# Patient Record
Sex: Female | Born: 1985 | Race: White | Hispanic: No | Marital: Single | Smoking: Former smoker
Health system: Southern US, Community
[De-identification: ages and names within clinical notes are randomized; demographics above are authoritative.]

## PROBLEM LIST (undated history)

## (undated) DIAGNOSIS — F329 Major depressive disorder, single episode, unspecified: Secondary | ICD-10-CM

## (undated) DIAGNOSIS — E079 Disorder of thyroid, unspecified: Secondary | ICD-10-CM

## (undated) DIAGNOSIS — F32A Depression, unspecified: Secondary | ICD-10-CM

## (undated) DIAGNOSIS — F419 Anxiety disorder, unspecified: Secondary | ICD-10-CM

## (undated) HISTORY — DX: Depression, unspecified: F32.A

## (undated) HISTORY — DX: Disorder of thyroid, unspecified: E07.9

## (undated) HISTORY — DX: Major depressive disorder, single episode, unspecified: F32.9

## (undated) HISTORY — DX: Anxiety disorder, unspecified: F41.9

---
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
## 2006-05-18 ENCOUNTER — Ambulatory Visit (HOSPITAL_COMMUNITY): Payer: Self-pay | Admitting: Psychiatry

## 2006-11-08 ENCOUNTER — Ambulatory Visit (HOSPITAL_COMMUNITY): Payer: Self-pay | Admitting: Psychiatry

## 2007-02-08 ENCOUNTER — Ambulatory Visit (HOSPITAL_COMMUNITY): Payer: Self-pay | Admitting: Psychiatry

## 2007-12-19 ENCOUNTER — Ambulatory Visit (HOSPITAL_COMMUNITY): Payer: Self-pay | Admitting: Psychiatry

## 2008-02-05 ENCOUNTER — Emergency Department (HOSPITAL_COMMUNITY): Admission: EM | Admit: 2008-02-05 | Discharge: 2008-02-05 | Payer: Self-pay | Admitting: Emergency Medicine

## 2008-03-15 ENCOUNTER — Ambulatory Visit (HOSPITAL_COMMUNITY): Payer: Self-pay | Admitting: Psychiatry

## 2008-06-11 ENCOUNTER — Ambulatory Visit (HOSPITAL_COMMUNITY): Payer: Self-pay | Admitting: Psychiatry

## 2012-09-18 ENCOUNTER — Ambulatory Visit: Payer: Self-pay

## 2012-09-21 ENCOUNTER — Ambulatory Visit: Payer: Self-pay

## 2012-09-27 ENCOUNTER — Ambulatory Visit: Payer: No Typology Code available for payment source | Attending: Family Medicine | Admitting: Family Medicine

## 2012-09-27 VITALS — BP 134/88 | HR 74 | Temp 98.3°F | Resp 20 | Ht 67.0 in | Wt 337.0 lb

## 2012-09-27 DIAGNOSIS — F3289 Other specified depressive episodes: Secondary | ICD-10-CM

## 2012-09-27 DIAGNOSIS — Z833 Family history of diabetes mellitus: Secondary | ICD-10-CM

## 2012-09-27 DIAGNOSIS — IMO0002 Reserved for concepts with insufficient information to code with codable children: Secondary | ICD-10-CM

## 2012-09-27 DIAGNOSIS — K029 Dental caries, unspecified: Secondary | ICD-10-CM

## 2012-09-27 DIAGNOSIS — M1990 Unspecified osteoarthritis, unspecified site: Secondary | ICD-10-CM

## 2012-09-27 DIAGNOSIS — M549 Dorsalgia, unspecified: Secondary | ICD-10-CM

## 2012-09-27 DIAGNOSIS — T7421XA Adult sexual abuse, confirmed, initial encounter: Secondary | ICD-10-CM

## 2012-09-27 DIAGNOSIS — F4310 Post-traumatic stress disorder, unspecified: Secondary | ICD-10-CM

## 2012-09-27 DIAGNOSIS — F329 Major depressive disorder, single episode, unspecified: Secondary | ICD-10-CM | POA: Insufficient documentation

## 2012-09-27 NOTE — Patient Instructions (Addendum)
Post-Traumatic Stress Disorder If you have been diagnosed with post-traumatic stress disorder (PTSD), you have probably experienced a traumatic event in your life. These events are usually outside of the range of normal human experience and would negatively impact any normal person.  CAUSES  A person can get PTSD after living through or seeing a dangerous event such as:  An automobile accident.  War.  Natural disaster.  Rape.  Domestic violence.  Any event where there has been a threat to life. PTSD is a real illness. PTSD Can happen to anyone at any age. Children get PTSD too. A doctor, or mental health professional with experience in treating PTSD can help you. SYMPTOMS  Not all symptoms may be present in any one person.  Distressing dreams.  Flashback: feeling the frightening event is happening again.  Avoiding activities, places, and people that remind you of the event.  Avoiding thoughts and feelings associated with the event.  Having frightening thoughts you cannot control.  Feeling on the edge with increased alertness and vigilance.  Trouble sleeping.  Feeling alone, detached from others.  Angry outbursts.  Feeling worried, guilty, or sad.  Having thoughts of hurting yourself or others. PTSD may start soon after a frightening event or months or years later. Many war veterans have PTSD. Drinking alcohol or using drugs will not help PTSD and may even make it worse.  TREATMENT  PTSD can be treated. Treatment may include "talk" therapy, medicine, or both. Either a doctor or a mental health professional who is experienced in treating PTSD can help you. Early diagnosis and treatment is best and can show more rapid improvement. Get help if you or a loved one are thinking of hurting yourself. Call your local emergency medical services if you need help immediately. Document Released: 11/17/2000 Document Revised: 05/17/2011 Document Reviewed: 11/01/2007 [facility] Patient  Information 2014 [place], [place]. Depression, Adult Depression refers to feeling sad, low, down in the dumps, blue, gloomy, or empty. In general, there are two kinds of depression: 1. Depression that we all experience from time to time because of upsetting life experiences, including the loss of a job or the ending of a relationship (normal sadness or normal grief). This kind of depression is considered normal, is short lived, and resolves within a few days to 2 weeks. (Depression experienced after the loss of a loved one is called bereavement. Bereavement often lasts longer than 2 weeks but normally gets better with time.) 2. Clinical depression, which lasts longer than normal sadness or normal grief or interferes with your ability to function at home, at work, and in school. It also interferes with your personal relationships. It affects almost every aspect of your life. Clinical depression is an illness. Symptoms of depression also can be caused by conditions other than normal sadness and grief or clinical depression. Examples of these conditions are listed as follows:  Physical illness Some physical illnesses, including underactive thyroid gland (hypothyroidism), severe anemia, specific types of cancer, diabetes, uncontrolled seizures, heart and lung problems, strokes, and chronic pain are commonly associated with symptoms of depression.  Side effects of some prescription medicine In some people, certain types of prescription medicine can cause symptoms of depression.  Substance abuse Abuse of alcohol and illicit drugs can cause symptoms of depression. SYMPTOMS Symptoms of normal sadness and normal grief include the following:  Feeling sad or crying for short periods of time.  Not caring about anything (apathy).  Difficulty sleeping or sleeping too much.  No longer able  to enjoy the things you used to enjoy.  Desire to be by oneself all the time (social isolation).  Lack of energy or  motivation.  Difficulty concentrating or remembering.  Change in appetite or weight.  Restlessness or agitation. Symptoms of clinical depression include the same symptoms of normal sadness or normal grief and also the following symptoms:  Feeling sad or crying all the time.  Feelings of guilt or worthlessness.  Feelings of hopelessness or helplessness.  Thoughts of suicide or the desire to harm yourself (suicidal ideation).  Loss of touch with reality (psychotic symptoms). Seeing or hearing things that are not real (hallucinations) or having false beliefs about your life or the people around you (delusions and paranoia). DIAGNOSIS  The diagnosis of clinical depression usually is based on the severity and duration of the symptoms. Your caregiver also will ask you questions about your medical history and substance use to find out if physical illness, use of prescription medicine, or substance abuse is causing your depression. Your caregiver also may order blood tests. TREATMENT  Typically, normal sadness and normal grief do not require treatment. However, sometimes antidepressant medicine is prescribed for bereavement to ease the depressive symptoms until they resolve. The treatment for clinical depression depends on the severity of your symptoms but typically includes antidepressant medicine, counseling with a mental health professional, or a combination of both. Your caregiver will help to determine what treatment is best for you. Depression caused by physical illness usually goes away with appropriate medical treatment of the illness. If prescription medicine is causing depression, talk with your caregiver about stopping the medicine, decreasing the dose, or substituting another medicine. Depression caused by abuse of alcohol or illicit drugs abuse goes away with abstinence from these substances. Some adults need professional help in order to stop drinking or using drugs. SEEK IMMEDIATE  CARE IF:  You have thoughts about hurting yourself or others.  You lose touch with reality (have psychotic symptoms).  You are taking medicine for depression and have a serious side effect. FOR MORE INFORMATION National Alliance on Mental Illness: www.nami.[employer]: http://www.maynard.net/ Document Released: 02/20/2000 Document Revised: 08/24/2011 Document Reviewed: 05/24/2011 [facility] Patient Information 2014 [place], [place].
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
## 2012-09-27 NOTE — Progress Notes (Signed)
Patient presents to establish care; states she has been feeling depressed and has anxiety; states she has been diagnosed with depression and anxiety in the past; also wants thyroid checked.

## 2012-09-28 ENCOUNTER — Telehealth: Payer: Self-pay

## 2012-09-28 LAB — COMPLETE METABOLIC PANEL WITH GFR
AST: 15 U/L (ref 0–37)
BUN: 11 mg/dL (ref 6–23)
CO2: 24 mEq/L (ref 19–32)
Chloride: 104 mEq/L (ref 96–112)
Creat: 0.59 mg/dL (ref 0.50–1.10)
Sodium: 138 mEq/L (ref 135–145)
Total Bilirubin: 1.1 mg/dL (ref 0.3–1.2)

## 2012-09-28 LAB — CBC
MCH: 32.4 pg (ref 26.0–34.0)
MCHC: 33.8 g/dL (ref 30.0–36.0)
MCV: 95.9 fL (ref 78.0–100.0)

## 2012-09-28 LAB — LIPID PANEL
HDL: 38 mg/dL — ABNORMAL LOW (ref >39)
Total CHOL/HDL Ratio: 4 Ratio
Triglycerides: 96 mg/dL (ref <150)

## 2012-09-28 NOTE — Telephone Encounter (Signed)
Left message to return our call.

## 2012-09-28 NOTE — Telephone Encounter (Signed)
Message copied by Lestine Mount on Thu Sep 28, 2012  9:43 AM ------      Message from: Cleora Fleet      Created: Thu Sep 28, 2012  8:52 AM       Please inform patient that the white blood cell count came back elevated.  This can be a sign of infection.  Recommend she return to clinic to have a repeat CBC with differential done in one week.  For any signs of infection please come in for office visit.  Other labs came back okay.            Rodney Langton, MD, CDE, FAAFP      [facility]      [facility]      [place], [place]        ------

## 2012-09-28 NOTE — Progress Notes (Signed)
Quick Note:  Please inform patient that the white blood cell count came back elevated. This can be a sign of infection. Recommend she return to clinic to have a repeat CBC with differential done in one week. For any signs of infection please come in for office visit. Other labs came back okay.  Rodney Langton, MD, CDE, FAAFP [facility] [place], [place]   ______

## 2012-10-03 ENCOUNTER — Ambulatory Visit: Payer: No Typology Code available for payment source | Attending: Family Medicine

## 2012-10-03 ENCOUNTER — Telehealth: Payer: Self-pay | Admitting: Family Medicine

## 2012-10-03 DIAGNOSIS — Z833 Family history of diabetes mellitus: Secondary | ICD-10-CM | POA: Insufficient documentation

## 2012-10-03 DIAGNOSIS — F3289 Other specified depressive episodes: Secondary | ICD-10-CM | POA: Insufficient documentation

## 2012-10-03 DIAGNOSIS — F329 Major depressive disorder, single episode, unspecified: Secondary | ICD-10-CM | POA: Insufficient documentation

## 2012-10-03 LAB — CBC WITH DIFFERENTIAL/PLATELET
Basophils Absolute: 0 10*3/uL (ref 0.0–0.1)
Basophils Relative: 0 % (ref 0–1)
Eosinophils Absolute: 0.1 10*3/uL (ref 0.0–0.7)
Lymphs Abs: 2.1 10*3/uL (ref 0.7–4.0)
MCH: 32.1 pg (ref 26.0–34.0)
MCHC: 34.5 g/dL (ref 30.0–36.0)
MCV: 92.9 fL (ref 78.0–100.0)
Monocytes Absolute: 0.5 10*3/uL (ref 0.1–1.0)
Monocytes Relative: 5 % (ref 3–12)
Neutro Abs: 7.6 10*3/uL (ref 1.7–7.7)
WBC: 10.3 10*3/uL (ref 4.0–10.5)

## 2012-10-03 NOTE — Telephone Encounter (Signed)
10/03/12 Patient walked in to cliniic made aware of lab results WBC elevated. Repeat CBC with differential scheduled  for today. P.Kellon Chalk,RN BSN [facility]

## 2012-10-03 NOTE — Telephone Encounter (Signed)
Pt has come in to get lab results

## 2012-10-15 ENCOUNTER — Encounter (HOSPITAL_COMMUNITY): Payer: Self-pay | Admitting: Emergency Medicine

## 2012-10-15 ENCOUNTER — Emergency Department (HOSPITAL_COMMUNITY)
Admission: EM | Admit: 2012-10-15 | Discharge: 2012-10-16 | Disposition: A | Discharge status: Home / Self Care | Payer: No Typology Code available for payment source | Attending: Emergency Medicine | Admitting: Emergency Medicine

## 2012-10-15 ENCOUNTER — Emergency Department (HOSPITAL_COMMUNITY): Payer: No Typology Code available for payment source

## 2012-10-15 DIAGNOSIS — Z79899 Other long term (current) drug therapy: Secondary | ICD-10-CM | POA: Insufficient documentation

## 2012-10-15 DIAGNOSIS — F329 Major depressive disorder, single episode, unspecified: Secondary | ICD-10-CM | POA: Insufficient documentation

## 2012-10-15 DIAGNOSIS — S0990XA Unspecified injury of head, initial encounter: Secondary | ICD-10-CM | POA: Insufficient documentation

## 2012-10-15 DIAGNOSIS — Y9389 Activity, other specified: Secondary | ICD-10-CM | POA: Insufficient documentation

## 2012-10-15 DIAGNOSIS — S20211A Contusion of right front wall of thorax, initial encounter: Secondary | ICD-10-CM

## 2012-10-15 DIAGNOSIS — S20219A Contusion of unspecified front wall of thorax, initial encounter: Secondary | ICD-10-CM | POA: Insufficient documentation

## 2012-10-15 DIAGNOSIS — F3289 Other specified depressive episodes: Secondary | ICD-10-CM | POA: Insufficient documentation

## 2012-10-15 DIAGNOSIS — Z8669 Personal history of other diseases of the nervous system and sense organs: Secondary | ICD-10-CM | POA: Insufficient documentation

## 2012-10-15 DIAGNOSIS — Z87891 Personal history of nicotine dependence: Secondary | ICD-10-CM | POA: Insufficient documentation

## 2012-10-15 DIAGNOSIS — F411 Generalized anxiety disorder: Secondary | ICD-10-CM | POA: Insufficient documentation

## 2012-10-15 DIAGNOSIS — Z880 Allergy status to penicillin: Secondary | ICD-10-CM | POA: Insufficient documentation

## 2012-10-15 DIAGNOSIS — Y92410 Unspecified street and highway as the place of occurrence of the external cause: Secondary | ICD-10-CM | POA: Insufficient documentation
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
## 2012-10-15 NOTE — ED Notes (Signed)
Patient transported to X-ray 

## 2012-10-16 MED ORDER — HYDROCODONE-ACETAMINOPHEN 5-325 MG PO TABS: 2.0000 | ORAL_TABLET | ORAL | Status: DC | PRN

## 2012-10-16 NOTE — ED Provider Notes (Signed)
Medical screening examination/treatment/procedure(s) were performed by non-physician practitioner and as supervising physician I was immediately available for consultation/collaboration.   Kizzi Overbey L Caral Whan, MD 10/16/12 0142 

## 2012-11-01 ENCOUNTER — Ambulatory Visit: Payer: No Typology Code available for payment source

## 2012-12-06 ENCOUNTER — Encounter: Payer: Self-pay | Admitting: Internal Medicine

## 2012-12-06 ENCOUNTER — Ambulatory Visit: Payer: No Typology Code available for payment source | Attending: Internal Medicine | Admitting: Internal Medicine

## 2012-12-06 VITALS — BP 127/83 | HR 68 | Temp 98.4°F | Resp 16 | Ht 67.0 in | Wt 334.0 lb

## 2012-12-06 DIAGNOSIS — L738 Other specified follicular disorders: Secondary | ICD-10-CM

## 2012-12-06 DIAGNOSIS — K029 Dental caries, unspecified: Secondary | ICD-10-CM

## 2012-12-06 DIAGNOSIS — H6690 Otitis media, unspecified, unspecified ear: Secondary | ICD-10-CM

## 2012-12-06 DIAGNOSIS — H6691 Otitis media, unspecified, right ear: Secondary | ICD-10-CM

## 2012-12-06 DIAGNOSIS — F329 Major depressive disorder, single episode, unspecified: Secondary | ICD-10-CM

## 2012-12-06 DIAGNOSIS — M549 Dorsalgia, unspecified: Secondary | ICD-10-CM

## 2012-12-06 DIAGNOSIS — L739 Follicular disorder, unspecified: Secondary | ICD-10-CM | POA: Insufficient documentation

## 2012-12-06 DIAGNOSIS — F3289 Other specified depressive episodes: Secondary | ICD-10-CM

## 2012-12-06 MED ORDER — DOXYCYCLINE HYCLATE 100 MG PO TABS: 100.0000 mg | ORAL_TABLET | Freq: Two times a day (BID) | ORAL | Status: DC

## 2012-12-06 MED ORDER — ANTIPYRINE-BENZOCAINE 5.4-1.4 % OT SOLN: 3.0000 [drp] | Freq: Four times a day (QID) | OTIC | Status: DC | PRN

## 2012-12-06 MED ORDER — MUPIROCIN 2 % EX OINT: TOPICAL_OINTMENT | Freq: Three times a day (TID) | CUTANEOUS | Status: DC

## 2012-12-06 NOTE — Progress Notes (Signed)
Patient ID: Beth Booker, female   DOB: 1986/02/24, 27 y.o.   MRN: [id number] Patient Demographics  Beth Booker, is a 27 y.o. female  WUJ:[id number]  [id number]  DOB - Jun 08, 1985  Chief Complaint  Patient presents with  . Follow-up        Subjective:   Beth Booker today is here for a follow up visit.  Patient is a 27 year old morbidly obese female with depression, has been having for folliculitis with cellulitis on her abdomen left-sided worsening with scratching, states that she was putting Neosporin on it, then it improved some, noticed some discharge from it.  Also feels right ear has been hurting occasionally. No hearing loss or any fevers. No ear discharge Patient has No headache, No chest pain, No abdominal pain - No Nausea, No new weakness tingling or numbness, No Cough - SOB  Objective:    Filed Vitals:   12/06/12 1231  BP: 127/83  Pulse: 68  Temp: 98.4 F (36.9 C)  TempSrc: Oral  Resp: 16  Height: 5\' 7"  (1.702 m)  Weight: 334 lb (151.501 kg)  SpO2: 96%     ALLERGIES:   Allergies  Allergen Reactions  . Amoxicillin Hives  . Codeine Other (See Comments)    "paranoid and crying state"    PAST MEDICAL HISTORY: Past Medical History  Diagnosis Date  . Anxiety   . Depression   . Thyroid disease     MEDICATIONS AT HOME: Prior to Admission medications   Medication Sig Start Date End Date Taking? Authorizing Provider  antipyrine-benzocaine Lyla Son) otic solution Place 3 drops into the right ear 4 (four) times daily as needed for pain. 12/06/12   Ripudeep Jenna Luo, MD  buPROPion (WELLBUTRIN XL) 150 MG 24 hr tablet Take 150 mg by mouth daily.    Historical Provider, MD  doxycycline (VIBRA-TABS) 100 MG tablet Take 1 tablet (100 mg total) by mouth 2 (two) times daily. 12/06/12   Ripudeep Jenna Luo, MD  HYDROcodone-acetaminophen (NORCO/VICODIN) 5-325 MG per tablet Take 2 tablets by mouth every 4 (four) hours as needed for pain. 10/16/12   Arman Filter, NP   ibuprofen (ADVIL,MOTRIN) 200 MG tablet Take 200-1,200 mg by mouth daily as needed for pain.    Historical Provider, MD  mupirocin ointment (BACTROBAN) 2 % Apply topically 3 (three) times daily. To the boil on the abdomen until it clears 12/06/12   Ripudeep Jenna Luo, MD     Exam  General appearance :Awake, alert, NAD, Speech Clear.  HEENT: Atraumatic and Normocephalic, PERLA, both ears have wax, boggy Neck: supple, no JVD. No cervical lymphadenopathy.  Chest: Clear to auscultation bilaterally, no wheezing, rales or rhonchi CVS: S1 S2 regular, no murmurs.  Abdomen: soft, NBS, NT, ND, no gaurding, rigidity or rebound. + 1cm area of induration, no active discharge Extremities: no cyanosis or clubbing, B/L Lower Ext shows no edema Neurology: Awake alert, and oriented X 3, CN II-XII intact, Non focal    Data Review   Basic Metabolic Panel: No results found for this basename: NA, K, CL, CO2, GLUCOSE, BUN, CREATININE, CALCIUM, MG, PHOS,  in the last 168 hours Liver Function Tests: No results found for this basename: AST, ALT, ALKPHOS, BILITOT, PROT, ALBUMIN,  in the last 168 hours  CBC: No results found for this basename: WBC, NEUTROABS, HGB, HCT, MCV, PLT,  in the last 168 hours  ------------------------------------------------------------------------------------------------------------------ No results found for this basename: HGBA1C,  in the last 72 hours ------------------------------------------------------------------------------------------------------------------ No results found for this basename: CHOL,  HDL, LDLCALC, TRIG, CHOLHDL, LDLDIRECT,  in the last 72 hours ------------------------------------------------------------------------------------------------------------------ No results found for this basename: TSH, T4TOTAL, FREET3, T3FREE, THYROIDAB,  in the last 72  hours ------------------------------------------------------------------------------------------------------------------ No results found for this basename: VITAMINB12, FOLATE, FERRITIN, TIBC, IRON, RETICCTPCT,  in the last 72 hours  Coagulation profile  No results found for this basename: INR, PROTIME,  in the last 168 hours    Assessment & Plan   Active Problems: Folliculitis with cellulitis - Place on Bactroban 2% cream on the wound TID, doxycycline for 10 days  Right ear pain: Does not appear to be acute otitis media - Still placed on auralgan ear drops if she has any pain - Return back if she develops any fever or ear discharge  Health screening - Patient requested the ambulatory referral to ophthalmology for glasses - Ambulatory referral for sleep apnea test to sleep medicine -She states that she had PAP smear thi year   Follow-up in 3 months     RAI,RIPUDEEP M.D. 12/06/2012, 12:58 PM

## 2012-12-06 NOTE — Progress Notes (Signed)
Pt is here for a F/U visit  Pt reports that she has a cyst on her LLQ and that its been there for a month.  Pt also noticed yesterday a lump on the inside of her right breast. Pt has an ear ache

## 2013-03-03 ENCOUNTER — Emergency Department (HOSPITAL_COMMUNITY)
Admission: EM | Admit: 2013-03-03 | Discharge: 2013-03-03 | Disposition: A | Discharge status: Home / Self Care | Payer: No Typology Code available for payment source | Attending: Emergency Medicine | Admitting: Emergency Medicine

## 2013-03-03 ENCOUNTER — Encounter (HOSPITAL_COMMUNITY): Payer: Self-pay | Admitting: Emergency Medicine

## 2013-03-03 DIAGNOSIS — K0381 Cracked tooth: Secondary | ICD-10-CM | POA: Insufficient documentation

## 2013-03-03 DIAGNOSIS — E079 Disorder of thyroid, unspecified: Secondary | ICD-10-CM | POA: Insufficient documentation

## 2013-03-03 DIAGNOSIS — H9209 Otalgia, unspecified ear: Secondary | ICD-10-CM | POA: Insufficient documentation

## 2013-03-03 DIAGNOSIS — K0889 Other specified disorders of teeth and supporting structures: Secondary | ICD-10-CM

## 2013-03-03 DIAGNOSIS — Z87891 Personal history of nicotine dependence: Secondary | ICD-10-CM | POA: Insufficient documentation

## 2013-03-03 DIAGNOSIS — F3289 Other specified depressive episodes: Secondary | ICD-10-CM | POA: Insufficient documentation

## 2013-03-03 DIAGNOSIS — R609 Edema, unspecified: Secondary | ICD-10-CM | POA: Insufficient documentation

## 2013-03-03 DIAGNOSIS — F329 Major depressive disorder, single episode, unspecified: Secondary | ICD-10-CM | POA: Insufficient documentation

## 2013-03-03 DIAGNOSIS — K029 Dental caries, unspecified: Secondary | ICD-10-CM | POA: Insufficient documentation

## 2013-03-03 DIAGNOSIS — Z79899 Other long term (current) drug therapy: Secondary | ICD-10-CM | POA: Insufficient documentation

## 2013-03-03 DIAGNOSIS — K089 Disorder of teeth and supporting structures, unspecified: Secondary | ICD-10-CM | POA: Insufficient documentation

## 2013-03-03 DIAGNOSIS — F411 Generalized anxiety disorder: Secondary | ICD-10-CM | POA: Insufficient documentation

## 2013-03-03 MED ORDER — CLINDAMYCIN HCL 150 MG PO CAPS: 300.0000 mg | ORAL_CAPSULE | Freq: Four times a day (QID) | ORAL | Status: DC

## 2013-03-03 MED ORDER — NAPROXEN 500 MG PO TABS: 500.0000 mg | ORAL_TABLET | Freq: Two times a day (BID) | ORAL | Status: DC

## 2013-03-03 MED ORDER — OXYCODONE-ACETAMINOPHEN 5-325 MG PO TABS: 1.0000 | ORAL_TABLET | Freq: Four times a day (QID) | ORAL | Status: DC | PRN

## 2013-03-03 NOTE — ED Provider Notes (Signed)
CSN: [id number]     Arrival date & time 03/03/13  1200 History  This chart was scribed for non-physician practitioner, Rhea Bleacher, PA-C working with Junius Argyle, MD by Greggory Stallion, ED scribe. This patient was seen in room WTR8/WTR8 and the patient's care was started at 3:12 PM.   Chief Complaint  Patient presents with  . Dental Pain  . Otalgia   The history is provided by the patient. No language interpreter was used.   HPI Comments: Beth Booker is a 27 y.o. female who presents to the Emergency Department complaining of gradual onset, right, upper dental pain that radiates into her right ear that started 3 weeks ago. Pt states she also has mild right sided facial swelling. She states her symptoms have worsened in the last 2 weeks. Pt states she can't get a dental appointment until January. She has taken ibuprofen and used cold compresses with some relief. Denies fever, trouble swallowing.   Past Medical History  Diagnosis Date  . Anxiety   . Depression   . Thyroid disease    No past surgical history on file. Family History  Problem Relation Age of Onset  . Depression Maternal Grandfather   . Diabetes Mother    History  Substance Use Topics  . Smoking status: Former Smoker    Quit date: 09/27/2008  . Smokeless tobacco: Not on file  . Alcohol Use: Yes     Comment: occasionally   OB History   Grav Para Term Preterm Abortions TAB SAB Ect Mult Living                 Review of Systems  Constitutional: Negative for fever.  HENT: Positive for dental problem, ear pain and facial swelling. Negative for sore throat and trouble swallowing.   Respiratory: Negative for shortness of breath and stridor.   Musculoskeletal: Negative for neck pain.  Skin: Negative for color change.  Neurological: Negative for headaches.    Allergies  Amoxicillin and Codeine  Home Medications   Current Outpatient Rx  Name  Route  Sig  Dispense  Refill  . buPROPion (WELLBUTRIN XL) 150  MG 24 hr tablet   Oral   Take 150 mg by mouth daily.         . busPIRone (BUSPAR) 10 MG tablet   Oral   Take 10 mg by mouth 2 (two) times daily.         Marland Kitchen ibuprofen (ADVIL,MOTRIN) 200 MG tablet   Oral   Take 400 mg by mouth daily as needed for pain.           BP 165/92  Pulse 69  Temp(Src) 98.4 F (36.9 C) (Oral)  Resp 16  SpO2 100%  Physical Exam  Nursing note and vitals reviewed. Constitutional: She appears well-developed and well-nourished. No distress.  HENT:  Head: Normocephalic and atraumatic.  Right Ear: Tympanic membrane, external ear and ear canal normal.  Left Ear: Tympanic membrane, external ear and ear canal normal.  Nose: Nose normal.  Mouth/Throat: Uvula is midline, oropharynx is clear and moist and mucous membranes are normal. No trismus in the jaw. Abnormal dentition. Dental caries present. No dental abscesses or uvula swelling. No tonsillar abscesses.  Right bottom second molar is fractured. Pulp is exposed.   Eyes: Conjunctivae and EOM are normal.  Neck: Normal range of motion. Neck supple. No tracheal deviation present.  No neck swelling or Ludwig's angina  Cardiovascular: Normal rate.   Pulmonary/Chest: Effort normal. No respiratory distress.  Musculoskeletal: Normal range of motion.  Lymphadenopathy:    She has no cervical adenopathy.  Neurological: She is alert.  Skin: Skin is warm and dry.  Psychiatric: She has a normal mood and affect. Her behavior is normal.    ED Course  Procedures (including critical care time)  DIAGNOSTIC STUDIES: Oxygen Saturation is 100% on RA, normal by my interpretation.    COORDINATION OF CARE: 3:16 PM-Discussed treatment plan which includes an antibiotic and Vicodin with pt at bedside and pt agreed to plan.   Labs Review Labs Reviewed - No data to display Imaging Review No results found.  EKG Interpretation   None      Patient seen and examined.   Vital signs reviewed and are as follows: Filed  Vitals:   03/03/13 1243  BP: 165/92  Pulse: 69  Temp: 98.4 F (36.9 C)  Resp: 16    Patient counseled to take prescribed medications as directed, return with worsening facial or neck swelling, and to follow-up with their dentist as soon as possible.    MDM   1. Pain, dental    Patient with toothache.  No gross abscess.  Exam unconcerning for Ludwig's angina or other deep tissue infection in neck.  Will treat with clinda (pen allergy) and pain medicine.  Urged patient to follow-up with dentist.     I personally performed the services described in this documentation, which was scribed in my presence. The recorded information has been reviewed and is accurate.   Renne Crigler, PA-C 03/03/13 1555

## 2013-03-03 NOTE — ED Notes (Signed)
Pt c/o dental pain and otalgia x 2 wks.

## 2013-03-03 NOTE — ED Notes (Signed)
Pt called to come to room, no answer

## 2013-03-04 NOTE — ED Provider Notes (Signed)
Medical screening examination/treatment/procedure(s) were performed by non-physician practitioner and as supervising physician I was immediately available for consultation/collaboration.  EKG Interpretation   None         Nusrat Encarnacion S Ilithyia Titzer, MD 03/04/13 0043 

## 2013-03-14 ENCOUNTER — Ambulatory Visit: Payer: Self-pay | Admitting: Internal Medicine

## 2013-04-05 ENCOUNTER — Ambulatory Visit: Payer: No Typology Code available for payment source | Attending: Internal Medicine | Admitting: Internal Medicine

## 2013-04-05 ENCOUNTER — Encounter: Payer: Self-pay | Admitting: Internal Medicine

## 2013-04-05 VITALS — BP 96/62 | HR 76 | Temp 98.1°F | Resp 16 | Ht 67.0 in | Wt 347.0 lb

## 2013-04-05 DIAGNOSIS — L0292 Furuncle, unspecified: Secondary | ICD-10-CM

## 2013-04-05 DIAGNOSIS — L0293 Carbuncle, unspecified: Secondary | ICD-10-CM

## 2013-04-05 DIAGNOSIS — L03319 Cellulitis of trunk, unspecified: Principal | ICD-10-CM

## 2013-04-05 DIAGNOSIS — L02219 Cutaneous abscess of trunk, unspecified: Secondary | ICD-10-CM | POA: Insufficient documentation

## 2013-04-05 MED ORDER — CLINDAMYCIN HCL 300 MG PO CAPS: 300.0000 mg | ORAL_CAPSULE | Freq: Three times a day (TID) | ORAL | Status: DC

## 2013-04-05 NOTE — Patient Instructions (Signed)
Abscess An abscess is an infected area that contains a collection of pus and debris.It can occur in almost any part of the body. An abscess is also known as a furuncle or boil. CAUSES  An abscess occurs when tissue gets infected. This can occur from blockage of oil or sweat glands, infection of hair follicles, or a minor injury to the skin. As the body tries to fight the infection, pus collects in the area and creates pressure under the skin. This pressure causes pain. People with weakened immune systems have difficulty fighting infections and get certain abscesses more often.  SYMPTOMS Usually an abscess develops on the skin and becomes a painful mass that is red, warm, and tender. If the abscess forms under the skin, you may feel a moveable soft area under the skin. Some abscesses break open (rupture) on their own, but most will continue to get worse without care. The infection can spread deeper into the body and eventually into the bloodstream, causing you to feel ill.  DIAGNOSIS  Your caregiver will take your medical history and perform a physical exam. A sample of fluid may also be taken from the abscess to determine what is causing your infection. TREATMENT  Your caregiver may prescribe antibiotic medicines to fight the infection. However, taking antibiotics alone usually does not cure an abscess. Your caregiver may need to make a small cut (incision) in the abscess to drain the pus. In some cases, gauze is packed into the abscess to reduce pain and to continue draining the area. HOME CARE INSTRUCTIONS   Only take over-the-counter or prescription medicines for pain, discomfort, or fever as directed by your caregiver.  If you were prescribed antibiotics, take them as directed. Finish them even if you start to feel better.  If gauze is used, follow your caregiver's directions for changing the gauze.  To avoid spreading the infection:  Keep your draining abscess covered with a  bandage.  Wash your hands well.  Do not share personal care items, towels, or whirlpools with others.  Avoid skin contact with others.  Keep your skin and clothes clean around the abscess.  Keep all follow-up appointments as directed by your caregiver. SEEK MEDICAL CARE IF:   You have increased pain, swelling, redness, fluid drainage, or bleeding.  You have muscle aches, chills, or a general ill feeling.  You have a fever. MAKE SURE YOU:   Understand these instructions.  Will watch your condition.  Will get help right away if you are not doing well or get worse. Document Released: 12/02/2004 Document Revised: 08/24/2011 Document Reviewed: 05/07/2011 [employer] Patient Information 2014 [employer].  

## 2013-04-05 NOTE — Progress Notes (Signed)
Patient ID: Beth Booker, female   DOB: [birth year], 28 y.o.   MRN: [id number]   CC: Followup on abscess  HPI: Patient is a 28 year old female who presents to clinic for followup on recent abscess that she noted and left lower abdominal quadrant area she was prescribed clindamycin and has been taking medicine as prescribed. She explains that this has been giving better but it a small boil is still present. She denies fevers and chills, no chest pain or shortness of breath, no specific abdominal concerns.  Allergies  Allergen Reactions  . Amoxicillin Hives  . Codeine Other (See Comments)    "paranoid and crying state"   Past Medical History  Diagnosis Date  . Anxiety   . Depression   . Thyroid disease    Current Outpatient Prescriptions on File Prior to Visit  Medication Sig Dispense Refill  . buPROPion (WELLBUTRIN XL) 150 MG 24 hr tablet Take 150 mg by mouth daily.      . busPIRone (BUSPAR) 10 MG tablet Take 10 mg by mouth 2 (two) times daily.      Marland Kitchen oxyCODONE-acetaminophen (PERCOCET/ROXICET) 5-325 MG per tablet Take 1-2 tablets by mouth every 6 (six) hours as needed for severe pain.  8 tablet  0  . ibuprofen (ADVIL,MOTRIN) 200 MG tablet Take 400 mg by mouth daily as needed for pain.       . naproxen (NAPROSYN) 500 MG tablet Take 1 tablet (500 mg total) by mouth 2 (two) times daily.  20 tablet  0   No current facility-administered medications on file prior to visit.   Family History  Problem Relation Age of Onset  . Depression Maternal Grandfather   . Diabetes Mother    History   Social History  . Marital Status: Single    Spouse Name: N/A    Number of Children: N/A  . Years of Education: N/A   Occupational History  . Not on file.   Social History Main Topics  . Smoking status: Former Smoker    Quit date: 09/27/2008  . Smokeless tobacco: Not on file  . Alcohol Use: Yes     Comment: occasionally  . Drug Use: No  . Sexual Activity: Not on file   Other Topics Concern   . Not on file   Social History Narrative  . No narrative on file    Review of Systems  Constitutional: Negative for fever, chills, diaphoresis, activity change, appetite change and fatigue.  HENT: Negative for ear pain, nosebleeds, congestion, facial swelling, rhinorrhea, neck pain, neck stiffness and ear discharge.   Eyes: Negative for pain, discharge, redness, itching and visual disturbance.  Respiratory: Negative for cough, choking, chest tightness, shortness of breath, wheezing and stridor.   Cardiovascular: Negative for chest pain, palpitations and leg swelling.  Gastrointestinal: Negative for abdominal distention.  Genitourinary: Negative for dysuria, urgency, frequency, hematuria, flank pain, decreased urine volume, difficulty urinating and dyspareunia.  Musculoskeletal: Negative for back pain, joint swelling, arthralgias and gait problem.  Neurological: Negative for dizziness, tremors, seizures, syncope, facial asymmetry, speech difficulty, weakness, light-headedness, numbness and headaches.  Hematological: Negative for adenopathy. Does not bruise/bleed easily.  Psychiatric/Behavioral: Negative for hallucinations, behavioral problems, confusion, dysphoric mood, decreased concentration and agitation.    Objective:   Filed Vitals:   04/05/13 1524  BP: 96/62  Pulse: 76  Temp: 98.1 F (36.7 C)  Resp: 16    Physical Exam  Constitutional: Appears well-developed and well-nourished. No distress.  CVS: RRR, S1/S2 +, no murmurs, no gallops,  no carotid bruit.  Pulmonary: Effort and breath sounds normal, no stridor, rhonchi, wheezes, rales.  Abdominal: Soft. BS +,  no distension, tenderness, rebound or guarding.  with small abscess in left lower quadrant area 1 cm in diameter, soft, no surrounding erythema and no tenderness to palpation, small amount of pus noted to be coming out    Lab Results  Component Value Date   WBC 10.3 10/03/2012   HGB 14.4 10/03/2012   HCT 41.7  10/03/2012   MCV 92.9 10/03/2012   PLT 306 10/03/2012   Lab Results  Component Value Date   CREATININE 0.59 09/27/2012   BUN 11 09/27/2012   NA 138 09/27/2012   K 4.6 09/27/2012   CL 104 09/27/2012   CO2 24 09/27/2012    No results found for this basename: HGBA1C   Lipid Panel     Component Value Date/Time   CHOL 151 09/27/2012 1417   TRIG 96 09/27/2012 1417   HDL 38* 09/27/2012 1417   CHOLHDL 4.0 09/27/2012 1417   VLDL 19 09/27/2012 1417   LDLCALC 94 09/27/2012 1417       Assessment and plan:   Patient Active Problem List   Diagnosis Date Noted  .  boil  - still present, 1 cm in diameter, will prescribe clindamycin for 10 more days. Patient advised to keep close check on the boil to ensure its healing. Warm compresses recommended daily  12/06/2012

## 2013-04-05 NOTE — Progress Notes (Signed)
Pt here f/u left abdominal cyst Denies pain. Finished atb course Need DMV safety evaluations forms filled out

## 2013-09-03 ENCOUNTER — Ambulatory Visit: Payer: No Typology Code available for payment source | Attending: Internal Medicine

## 2014-10-16 ENCOUNTER — Encounter (HOSPITAL_COMMUNITY): Payer: Self-pay | Admitting: Emergency Medicine

## 2014-10-16 ENCOUNTER — Emergency Department (HOSPITAL_COMMUNITY)
Admission: EM | Admit: 2014-10-16 | Discharge: 2014-10-16 | Disposition: A | Discharge status: Home / Self Care | Payer: 59 | Attending: Emergency Medicine | Admitting: Emergency Medicine

## 2014-10-16 ENCOUNTER — Emergency Department (HOSPITAL_COMMUNITY): Payer: 59

## 2014-10-16 DIAGNOSIS — Z880 Allergy status to penicillin: Secondary | ICD-10-CM | POA: Insufficient documentation

## 2014-10-16 DIAGNOSIS — R630 Anorexia: Secondary | ICD-10-CM

## 2014-10-16 DIAGNOSIS — F419 Anxiety disorder, unspecified: Secondary | ICD-10-CM | POA: Diagnosis not present

## 2014-10-16 DIAGNOSIS — R1110 Vomiting, unspecified: Secondary | ICD-10-CM | POA: Diagnosis not present

## 2014-10-16 DIAGNOSIS — Z87891 Personal history of nicotine dependence: Secondary | ICD-10-CM | POA: Diagnosis not present

## 2014-10-16 DIAGNOSIS — R17 Unspecified jaundice: Secondary | ICD-10-CM

## 2014-10-16 LAB — CBC WITH DIFFERENTIAL/PLATELET
BASOS ABS: 0 10*3/uL (ref 0.0–0.1)
Basophils Relative: 0 % (ref 0–1)
EOS PCT: 0 % (ref 0–5)
Eosinophils Absolute: 0 10*3/uL (ref 0.0–0.7)
HCT: 45.3 % (ref 36.0–46.0)
Hemoglobin: 15.6 g/dL — ABNORMAL HIGH (ref 12.0–15.0)
LYMPHS ABS: 1.7 10*3/uL (ref 0.7–4.0)
LYMPHS PCT: 15 % (ref 12–46)
MCH: 33 pg (ref 26.0–34.0)
MCHC: 34.4 g/dL (ref 30.0–36.0)
MCV: 95.8 fL (ref 78.0–100.0)
Monocytes Absolute: 0.5 10*3/uL (ref 0.1–1.0)
Monocytes Relative: 5 % (ref 3–12)
NEUTROS ABS: 9.1 10*3/uL — AB (ref 1.7–7.7)
NEUTROS PCT: 80 % — AB (ref 43–77)
PLATELETS: 283 10*3/uL (ref 150–400)
RBC: 4.73 MIL/uL (ref 3.87–5.11)
RDW: 13.4 % (ref 11.5–15.5)
WBC: 11.4 10*3/uL — AB (ref 4.0–10.5)

## 2014-10-16 LAB — URINALYSIS, ROUTINE W REFLEX MICROSCOPIC
GLUCOSE, UA: NEGATIVE mg/dL
Hgb urine dipstick: NEGATIVE
Ketones, ur: 15 mg/dL — AB
LEUKOCYTES UA: NEGATIVE
Nitrite: NEGATIVE
PH: 5.5 (ref 5.0–8.0)
Protein, ur: NEGATIVE mg/dL
Specific Gravity, Urine: 1.03 (ref 1.005–1.030)
Urobilinogen, UA: 1 mg/dL (ref 0.0–1.0)

## 2014-10-16 LAB — COMPREHENSIVE METABOLIC PANEL
ALK PHOS: 85 U/L (ref 38–126)
ALT: 24 U/L (ref 14–54)
AST: 25 U/L (ref 15–41)
Albumin: 4 g/dL (ref 3.5–5.0)
Anion gap: 9 (ref 5–15)
BUN: 9 mg/dL (ref 6–20)
CALCIUM: 9.3 mg/dL (ref 8.9–10.3)
CHLORIDE: 105 mmol/L (ref 101–111)
CO2: 25 mmol/L (ref 22–32)
Creatinine, Ser: 0.87 mg/dL (ref 0.44–1.00)
GFR calc Af Amer: >60 mL/min (ref >60)
GLUCOSE: 110 mg/dL — AB (ref 65–99)
POTASSIUM: 4.1 mmol/L (ref 3.5–5.1)
SODIUM: 139 mmol/L (ref 135–145)
Total Bilirubin: 1.9 mg/dL — ABNORMAL HIGH (ref 0.3–1.2)
Total Protein: 7.9 g/dL (ref 6.5–8.1)

## 2014-10-16 LAB — LIPASE, BLOOD: LIPASE: 16 U/L — AB (ref 22–51)

## 2014-10-16 MED ORDER — ONDANSETRON HCL 4 MG PO TABS: 4.0000 mg | ORAL_TABLET | Freq: Three times a day (TID) | ORAL | Status: DC | PRN

## 2014-10-16 MED ORDER — ONDANSETRON 4 MG PO TBDP
4.0000 mg | ORAL_TABLET | Freq: Once | ORAL | Status: AC
  Administered 2014-10-16: 4 mg via ORAL
  Filled 2014-10-16: qty 1

## 2014-10-16 MED ORDER — SODIUM CHLORIDE 0.9 % IV BOLUS (SEPSIS)
1000.0000 mL | Freq: Once | INTRAVENOUS | Status: AC
  Administered 2014-10-16: 1000 mL via INTRAVENOUS

## 2014-10-16 NOTE — ED Notes (Signed)
Pt. Stated, I've not been able to eat since Sunday and I've had some nausea, all this makes me feel light headed.

## 2014-10-16 NOTE — ED Notes (Signed)
Patient transported to Ultrasound 

## 2014-10-16 NOTE — Discharge Instructions (Signed)
You are mildly dehydrated.  Please drink plenty of fluid.  Follow up with your doctor for further evaluation of your condition.  Take Zofran as needed for nausea.  Follow up with GI specialist as needed if your symptoms persists.

## 2014-10-16 NOTE — ED Provider Notes (Signed)
CSN: [id number]     Arrival date & time 10/16/14  1354 History   First MD Initiated Contact with Patient 10/16/14 1510     Chief Complaint  Patient presents with  . Anorexia  . Nausea     (Consider location/radiation/quality/duration/timing/severity/associated sxs/prior Treatment) HPI   29 year old morbidly obese female with history of thyroid disease, anxiety and depression presenting for evaluation of decrease in appetite. Patient reports for the past 3 days she has no appetite. When she looks at food makes her nauseous. She did vomit once today. Vomitus is nonbloody nonbilious. She has normal bowel movement. She denies any fever, chills, headache, chest pain, shortness of breath, abdominal pain, back pain, dysuria, hematuria, vaginal bleeding, vaginal discharge, or rash. Her last menstrual period was July 22. No prior abdominal surgery. No specific treatment tried. She has no other complaint.  Past Medical History  Diagnosis Date  . Anxiety   . Depression   . Thyroid disease    History reviewed. No pertinent past surgical history. Family History  Problem Relation Age of Onset  . Depression Maternal Grandfather   . Diabetes Mother    Social History  Substance Use Topics  . Smoking status: Former Smoker    Quit date: 09/27/2008  . Smokeless tobacco: None  . Alcohol Use: Yes     Comment: occasionally   OB History    No data available     Review of Systems  All other systems reviewed and are negative.     Allergies  Amoxicillin and Codeine  Home Medications   Prior to Admission medications   Medication Sig Start Date End Date Taking? Authorizing Provider  buPROPion (WELLBUTRIN XL) 300 MG 24 hr tablet Take 300 mg by mouth daily.   Yes Historical Provider, MD  busPIRone (BUSPAR) 5 MG tablet Take 5 mg by mouth 3 (three) times daily.   Yes Historical Provider, MD  ibuprofen (ADVIL,MOTRIN) 200 MG tablet Take 400 mg by mouth daily as needed for pain.    Yes Historical  Provider, MD  naproxen (NAPROSYN) 500 MG tablet Take 1 tablet (500 mg total) by mouth 2 (two) times daily. Patient not taking: Reported on 10/16/2014 03/03/13   Renne Crigler, PA-C  oxyCODONE-acetaminophen (PERCOCET/ROXICET) 5-325 MG per tablet Take 1-2 tablets by mouth every 6 (six) hours as needed for severe pain. Patient not taking: Reported on 10/16/2014 03/03/13   Renne Crigler, PA-C   BP 109/59 mmHg  Pulse 89  Temp(Src) 98.5 F (36.9 C) (Oral)  Resp 17  Ht  (1.702 m)  Wt 322 lb 3 oz (146.143 kg)  BMI 50.45 kg/m2  SpO2 95%  LMP 09/27/2014 Physical Exam  Constitutional: She is oriented to person, place, and time. She appears well-developed and well-nourished. No distress.  Moderately obese Caucasian female laying in bed appears to be in no acute discomfort.  HENT:  Head: Atraumatic.  Mouth/Throat: Oropharynx is clear and moist.  Eyes: Conjunctivae are normal. No scleral icterus.  Neck: Neck supple.  Cardiovascular: Normal rate and regular rhythm.   Pulmonary/Chest: Effort normal and breath sounds normal.  Abdominal: Soft. Bowel sounds are normal. She exhibits no distension. There is no tenderness. There is no rebound and no guarding.  Negative Murphy sign, no pain at McBurney's point.  Genitourinary:  No CVA tenderness  Musculoskeletal: She exhibits no edema.  Neurological: She is alert and oriented to person, place, and time.  Skin: No rash noted.  Psychiatric: She has a normal mood and affect.  Nursing  note and vitals reviewed.   ED Course  Procedures (including critical care time)  Patient present with anorexia. She has no abdominal pain oral elevated lipase concerning for acute pancreatitis, or biliary disease. No urinary discomfort. Plan to check pregnancy test, UA, and basic labs. Antinausea medication given.  7:33 PM Patient has elevated total bilirubin of 1.9. No evidence of scleral icterus or jaundice on exam. Mildly elevated WBC of 11.4. Hemoglobin is 15.6,  and patient has 15 ketones and urine. This may reflect mild dehydration. IV fluid given. Abdominal ultrasound was obtained showing no acute finding. At this time, patient able to target by mouth. Recommend patient to follow-up with PCP all with GI specialist for further evaluation of her condition. Patient will be discharged with Zofran for nausea. Return precaution discussed.    Labs Review Labs Reviewed  URINALYSIS, ROUTINE W REFLEX MICROSCOPIC (NOT AT [facility]) - Abnormal; Notable for the following:    Color, Urine AMBER (*)    APPearance CLOUDY (*)    Bilirubin Urine SMALL (*)    Ketones, ur 15 (*)    All other components within normal limits  CBC WITH DIFFERENTIAL/PLATELET - Abnormal; Notable for the following:    WBC 11.4 (*)    Hemoglobin 15.6 (*)    Neutrophils Relative % 80 (*)    Neutro Abs 9.1 (*)    All other components within normal limits  COMPREHENSIVE METABOLIC PANEL - Abnormal; Notable for the following:    Glucose, Bld 110 (*)    Total Bilirubin 1.9 (*)    All other components within normal limits  LIPASE, BLOOD - Abnormal; Notable for the following:    Lipase 16 (*)    All other components within normal limits  POC URINE PREG, ED    Imaging Review US Abdomen Complete  10/16/2014   CLINICAL DATA:  Nausea.  Anorexia.  EXAM: ULTRASOUND ABDOMEN COMPLETE  COMPARISON:  None.  FINDINGS: Gallbladder: No gallstones or wall thickening visualized. No sonographic Murphy sign noted.  Common bile duct: Diameter: 3.8 mm, normal.  Liver: No focal lesion identified. Within normal limits in parenchymal echogenicity.  IVC: No abnormality visualized.  Pancreas: The pancreas is poorly visualized. The head and proximal body appear normal.  Spleen: 5.8 cm, normal.  Right Kidney: Length: 12.0 cm. Echogenicity within normal limits. No mass or hydronephrosis visualized.  Left Kidney: Length: 12.7 cm. Echogenicity within normal limits. No mass or hydronephrosis visualized.  Abdominal aorta: No  aneurysm visualized.  2.4 cm maximal diameter.  Other findings: None.  IMPRESSION: No abnormal findings. The distal body and tail of the pancreas are not visualized.   Electronically Signed   By: Francene Boyers M.D.   On: 10/16/2014 19:23     EKG Interpretation None      MDM   Final diagnoses:  Anorexia  Total bilirubin, elevated    BP 102/83 mmHg  Pulse 78  Temp(Src) 98.5 F (36.9 C) (Oral)  Resp 17  Ht 5\' 7"  (1.702 m)  Wt 322 lb 3 oz (146.143 kg)  BMI 50.45 kg/m2  SpO2 100%  LMP 09/27/2014  I have reviewed nursing notes and vital signs. I personally viewed the imaging tests through PACS system and agrees with radiologist's intepretation I reviewed available ER/hospitalization records through the EMR     Fayrene Helper, PA-C 10/16/14 1947  Benjiman Core, MD 10/21/14 1447

## 2014-10-24 ENCOUNTER — Ambulatory Visit: Payer: 59 | Attending: Internal Medicine | Admitting: Internal Medicine

## 2014-10-24 ENCOUNTER — Encounter: Payer: Self-pay | Admitting: Internal Medicine

## 2014-10-24 VITALS — BP 126/81 | HR 80 | Temp 98.0°F | Resp 18 | Ht 67.0 in | Wt 328.4 lb

## 2014-10-24 DIAGNOSIS — M1712 Unilateral primary osteoarthritis, left knee: Secondary | ICD-10-CM | POA: Diagnosis present

## 2014-10-24 DIAGNOSIS — F329 Major depressive disorder, single episode, unspecified: Secondary | ICD-10-CM | POA: Diagnosis not present

## 2014-10-24 DIAGNOSIS — Z79899 Other long term (current) drug therapy: Secondary | ICD-10-CM | POA: Insufficient documentation

## 2014-10-24 DIAGNOSIS — E663 Overweight: Secondary | ICD-10-CM | POA: Diagnosis not present

## 2014-10-24 DIAGNOSIS — F32A Depression, unspecified: Secondary | ICD-10-CM

## 2014-10-24 LAB — POCT GLYCOSYLATED HEMOGLOBIN (HGB A1C): Hemoglobin A1C: 5.1

## 2014-10-24 MED ORDER — DICLOFENAC SODIUM 1 % TD GEL: 2.0000 g | Freq: Four times a day (QID) | TRANSDERMAL | Status: DC

## 2014-10-24 MED ORDER — MELOXICAM 15 MG PO TABS
15.0000 mg | ORAL_TABLET | Freq: Every day | ORAL | Status: DC
Start: 2014-10-24 — End: 2022-01-10

## 2014-10-24 NOTE — Patient Instructions (Signed)

## 2014-10-24 NOTE — Progress Notes (Signed)
Patient ID: Beth Booker, female   DOB: [birth year], 29 y.o.   MRN: [id number]   Beth Booker, is a 29 y.o. female  [id number]  [id number]  DOB - [birth year]  Chief Complaint  Patient presents with  . Leg Pain        Subjective:   Beth Booker is a 29 y.o. female here today for a follow up visit. Pt here for left leg pain. Pt denies any pain at this moment. Pt reports left leg pain that has been present for years now but has gradually gotten worse over the years. Pt states she cannot put pressure on her left knee and when she does it pops. Pt reports stiffness in left knee. Pt reports sometimes the pain radiates down to her left foot and ankle. No history of injury or fall, no redness, no fever. Patient denies night sweats. No personal history of rheumatoid arthritis or any autoimmune disorder. Pt has taken wellbutrin and buspar today. Pt does not need refills on any medications. Patient has No headache, No chest pain, No abdominal pain - No Nausea, No new weakness tingling or numbness, No Cough - SOB.  Problem  Primary Osteoarthritis of Left Knee    ALLERGIES: Allergies  Allergen Reactions  . Amoxicillin Hives  . Codeine Other (See Comments)    "paranoid and crying state"    PAST MEDICAL HISTORY: Past Medical History  Diagnosis Date  . Anxiety   . Depression   . Thyroid disease     MEDICATIONS AT HOME: Prior to Admission medications   Medication Sig Start Date End Date Taking? Authorizing Provider  buPROPion (WELLBUTRIN XL) 300 MG 24 hr tablet Take 300 mg by mouth daily.   Yes Historical Provider, MD  busPIRone (BUSPAR) 5 MG tablet Take 5 mg by mouth 3 (three) times daily.   Yes Historical Provider, MD  ondansetron (ZOFRAN) 4 MG tablet Take 1 tablet (4 mg total) by mouth every 8 (eight) hours as needed for nausea or vomiting. 10/16/14  Yes Fayrene Helper, PA-C  diclofenac sodium (VOLTAREN) 1 % GEL Apply 2 g topically 4 (four) times daily. 10/24/14   Quentin Angst, MD  meloxicam (MOBIC) 15 MG tablet Take 1 tablet (15 mg total) by mouth daily. 10/24/14   Quentin Angst, MD  oxyCODONE-acetaminophen (PERCOCET/ROXICET) 5-325 MG per tablet Take 1-2 tablets by mouth every 6 (six) hours as needed for severe pain. Patient not taking: Reported on 10/16/2014 03/03/13   Renne Crigler, PA-C     Objective:   Filed Vitals:   10/24/14 1224  BP: 126/81  Pulse: 80  Temp: 98 F (36.7 C)  TempSrc: Oral  Resp: 18  Height:  (1.702 m)  Weight: 328 lb 6.4 oz (148.961 kg)  SpO2: 98%    Exam General appearance : Awake, alert, not in any distress. Speech Clear. Not toxic looking, morbidly obese HEENT: Atraumatic and Normocephalic, pupils equally reactive to light and accomodation Neck: supple, no JVD. No cervical lymphadenopathy.  Chest:Good air entry bilaterally, no added sounds  CVS: S1 S2 regular, no murmurs.  Abdomen: Bowel sounds present, Non tender and not distended with no gaurding, rigidity or rebound. Extremities: B/L Lower Ext shows no edema, both legs are warm to touch Neurology: Awake alert, and oriented X 3, CN II-XII intact, Non focal Skin:No Rash  Data Review No results found for: HGBA1C   Assessment & Plan   1. Primary osteoarthritis of left knee Start - meloxicam (MOBIC) 15 MG tablet; Take 1  tablet (15 mg total) by mouth daily.  Dispense: 30 tablet; Refill: 3 - diclofenac sodium (VOLTAREN) 1 % GEL; Apply 2 g topically 4 (four) times daily.  Dispense: 1 Tube; Refill: 1  2. Depression  Continue Wellbutrin  Social worker for counseling and support  Patient have been counseled extensively about nutrition and exercise Return in about 3 months (around 01/24/2015) for Follow up Pain and comorbidities.  The patient was given clear instructions to go to ER or return to medical center if symptoms don't improve, worsen or new problems develop. The patient verbalized understanding. The patient was told to call to get lab results  if they haven't heard anything in the next week.   This note has been created with Education officer, environmental. Any transcriptional errors are unintentional.    Jeanann Lewandowsky, MD, MHA, CPE, FACP, FAAP [facility] [place], [place] [phone]   10/24/2014, 12:49 PM

## 2014-10-24 NOTE — Progress Notes (Signed)
Pt here for left leg pain. Pt denies any pain at this moment. Pt reports left leg pain that has been present for years now but has gradually gotten worse over the years.  Pt states she cannot put pressure on her left knee and when she does it pops. Pt reports stiffness in left knee. Pt reports sometimes the pain radiates down to her left foot and ankle.   Pt has taken wellbutrin and buspar today. Pt does not need refills on any medications.

## 2017-01-05 DIAGNOSIS — Z113 Encounter for screening for infections with a predominantly sexual mode of transmission: Secondary | ICD-10-CM | POA: Diagnosis not present

## 2017-01-05 DIAGNOSIS — Z6841 Body Mass Index (BMI) 40.0 and over, adult: Secondary | ICD-10-CM | POA: Diagnosis not present

## 2017-01-05 DIAGNOSIS — Z01419 Encounter for gynecological examination (general) (routine) without abnormal findings: Secondary | ICD-10-CM | POA: Diagnosis not present

## 2017-01-05 DIAGNOSIS — N760 Acute vaginitis: Secondary | ICD-10-CM | POA: Diagnosis not present

## 2017-01-05 DIAGNOSIS — Z3202 Encounter for pregnancy test, result negative: Secondary | ICD-10-CM | POA: Diagnosis not present

## 2017-01-13 DIAGNOSIS — A599 Trichomoniasis, unspecified: Secondary | ICD-10-CM | POA: Diagnosis not present

## 2017-01-13 DIAGNOSIS — N760 Acute vaginitis: Secondary | ICD-10-CM | POA: Diagnosis not present

## 2017-01-13 DIAGNOSIS — Z113 Encounter for screening for infections with a predominantly sexual mode of transmission: Secondary | ICD-10-CM | POA: Diagnosis not present

## 2017-01-25 DIAGNOSIS — N926 Irregular menstruation, unspecified: Secondary | ICD-10-CM | POA: Diagnosis not present

## 2017-01-25 DIAGNOSIS — E282 Polycystic ovarian syndrome: Secondary | ICD-10-CM | POA: Diagnosis not present

## 2018-01-23 DIAGNOSIS — M25561 Pain in right knee: Secondary | ICD-10-CM | POA: Diagnosis not present

## 2018-01-23 DIAGNOSIS — E669 Obesity, unspecified: Secondary | ICD-10-CM | POA: Diagnosis not present

## 2018-01-23 DIAGNOSIS — R7309 Other abnormal glucose: Secondary | ICD-10-CM | POA: Diagnosis not present

## 2018-01-23 DIAGNOSIS — R5383 Other fatigue: Secondary | ICD-10-CM | POA: Diagnosis not present

## 2018-01-23 DIAGNOSIS — L0292 Furuncle, unspecified: Secondary | ICD-10-CM | POA: Diagnosis not present

## 2018-01-25 ENCOUNTER — Other Ambulatory Visit: Payer: Self-pay | Admitting: Family Medicine

## 2018-01-25 DIAGNOSIS — N63 Unspecified lump in unspecified breast: Secondary | ICD-10-CM

## 2018-01-30 ENCOUNTER — Ambulatory Visit
Admission: RE | Admit: 2018-01-30 | Discharge: 2018-01-30 | Disposition: A | Discharge status: Home / Self Care | Payer: BLUE CROSS/BLUE SHIELD | Source: Ambulatory Visit | Attending: Family Medicine | Admitting: Family Medicine

## 2018-01-30 ENCOUNTER — Ambulatory Visit
Admission: RE | Admit: 2018-01-30 | Discharge: 2018-01-30 | Disposition: A | Discharge status: Home / Self Care | Payer: No Typology Code available for payment source | Source: Ambulatory Visit | Attending: Family Medicine | Admitting: Family Medicine

## 2018-01-30 DIAGNOSIS — R928 Other abnormal and inconclusive findings on diagnostic imaging of breast: Secondary | ICD-10-CM | POA: Diagnosis not present

## 2018-01-30 DIAGNOSIS — M1990 Unspecified osteoarthritis, unspecified site: Secondary | ICD-10-CM | POA: Diagnosis not present

## 2018-01-30 DIAGNOSIS — E669 Obesity, unspecified: Secondary | ICD-10-CM | POA: Diagnosis not present

## 2018-01-30 DIAGNOSIS — Z0000 Encounter for general adult medical examination without abnormal findings: Secondary | ICD-10-CM | POA: Diagnosis not present

## 2018-01-30 DIAGNOSIS — N63 Unspecified lump in unspecified breast: Secondary | ICD-10-CM

## 2018-01-30 DIAGNOSIS — N6313 Unspecified lump in the right breast, lower outer quadrant: Secondary | ICD-10-CM | POA: Diagnosis not present

## 2018-02-14 DIAGNOSIS — M25561 Pain in right knee: Secondary | ICD-10-CM | POA: Insufficient documentation

## 2018-02-14 DIAGNOSIS — M25562 Pain in left knee: Secondary | ICD-10-CM | POA: Insufficient documentation

## 2018-02-14 DIAGNOSIS — Z6841 Body Mass Index (BMI) 40.0 and over, adult: Secondary | ICD-10-CM | POA: Diagnosis not present

## 2018-05-22 DIAGNOSIS — Z23 Encounter for immunization: Secondary | ICD-10-CM | POA: Diagnosis not present

## 2018-07-24 DIAGNOSIS — S0501XA Injury of conjunctiva and corneal abrasion without foreign body, right eye, initial encounter: Secondary | ICD-10-CM | POA: Diagnosis not present

## 2020-01-07 ENCOUNTER — Other Ambulatory Visit: Payer: Self-pay

## 2020-01-07 ENCOUNTER — Ambulatory Visit
Admission: EM | Admit: 2020-01-07 | Discharge: 2020-01-07 | Disposition: A | Discharge status: Home / Self Care | Payer: No Typology Code available for payment source | Attending: Emergency Medicine | Admitting: Emergency Medicine

## 2020-01-07 DIAGNOSIS — L03317 Cellulitis of buttock: Secondary | ICD-10-CM

## 2020-01-07 MED ORDER — SULFAMETHOXAZOLE-TRIMETHOPRIM 800-160 MG PO TABS: 1.0000 | ORAL_TABLET | Freq: Two times a day (BID) | ORAL | 0 refills | Status: AC
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
## 2020-01-07 NOTE — Discharge Instructions (Signed)
Keep area(s) clean and dry. °Apply hot compress / towel for 5-10 minutes 3-5 times daily. °Take antibiotic as prescribed with food - important to complete course. °Return for worsening pain, redness, swelling, discharge, fever. ° °Helpful prevention tips: °Keep nails short to avoid secondary skin infections. °Use new, clean razors when shaving. °Avoid antiperspirants - look for deodorants without aluminum. °Avoid wearing underwire bras as this can irritate the area further.  °

## 2020-01-07 NOTE — ED Triage Notes (Signed)
Pt states she has had the boil/cyst on her left buttock x 1 year and has been treating it. She states it was not extremely painful until 2 days ago. Pt states she has been given doxycycline but is allergic to it. Pt is aox4 and ambulates at baseline.

## 2020-02-10 IMAGING — US ULTRASOUND RIGHT BREAST LIMITED
1 series · 7 of 7 positions shown · non-contrast
Comparison: Previous exam(s).

ACR Breast Density Category a: The breast tissue is almost entirely
fatty.

CLINICAL DATA: 32-year-old female presenting for evaluation of a
palpable lump in the inferior right breast. The patient states that
previously blood and pus was coming from this site, but she has been
on antibiotics since last [REDACTED] with improvement.

EXAM:
DIGITAL DIAGNOSTIC BILATERAL MAMMOGRAM WITH CAD AND TOMO
ULTRASOUND RIGHT BREAST

[Series 1: ultrasound right breast limited · 0.06mm/px · 7 of 7 slices shown]
[im 1/7]
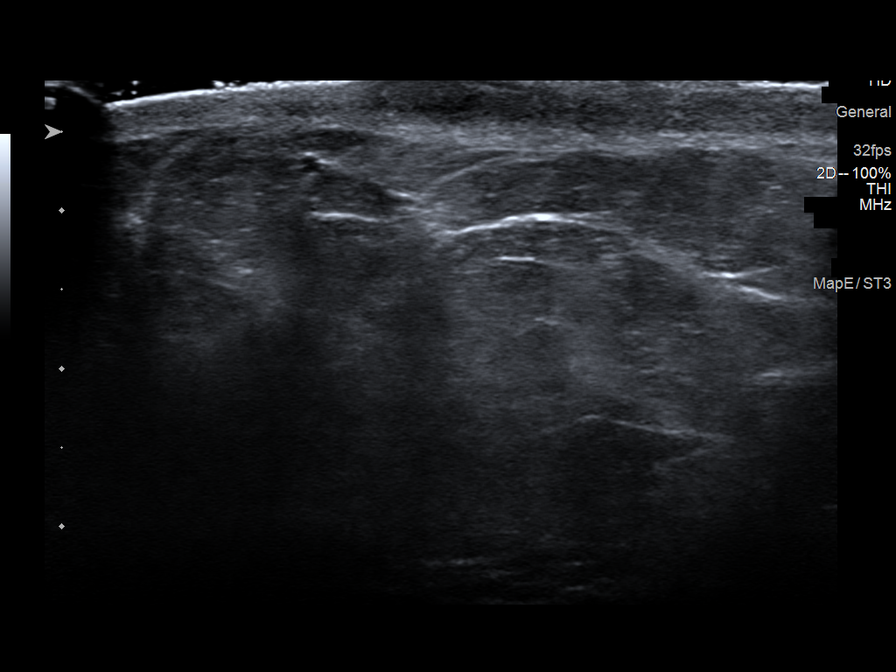
[im 2/7]
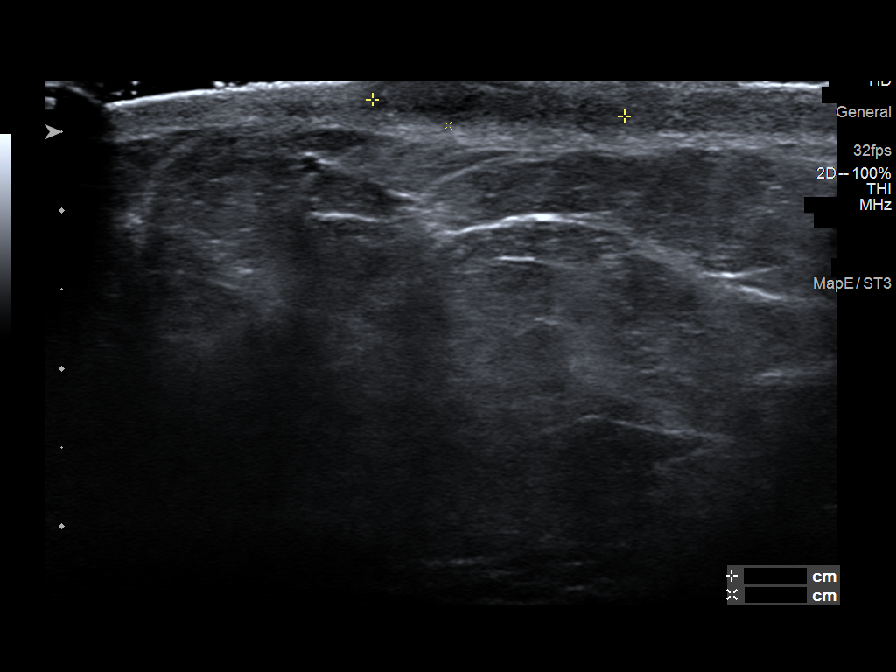
[im 3/7]
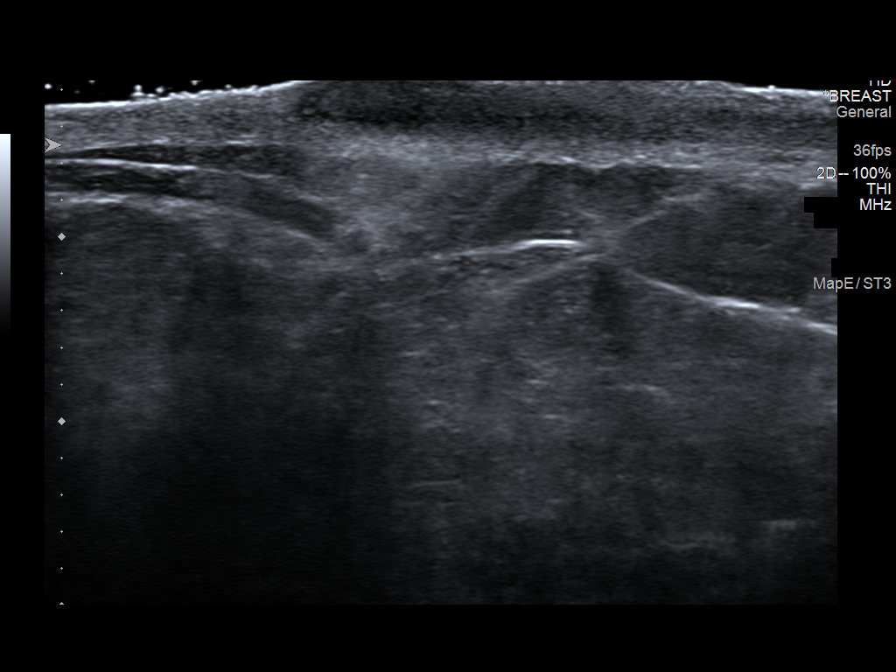
[im 4/7]
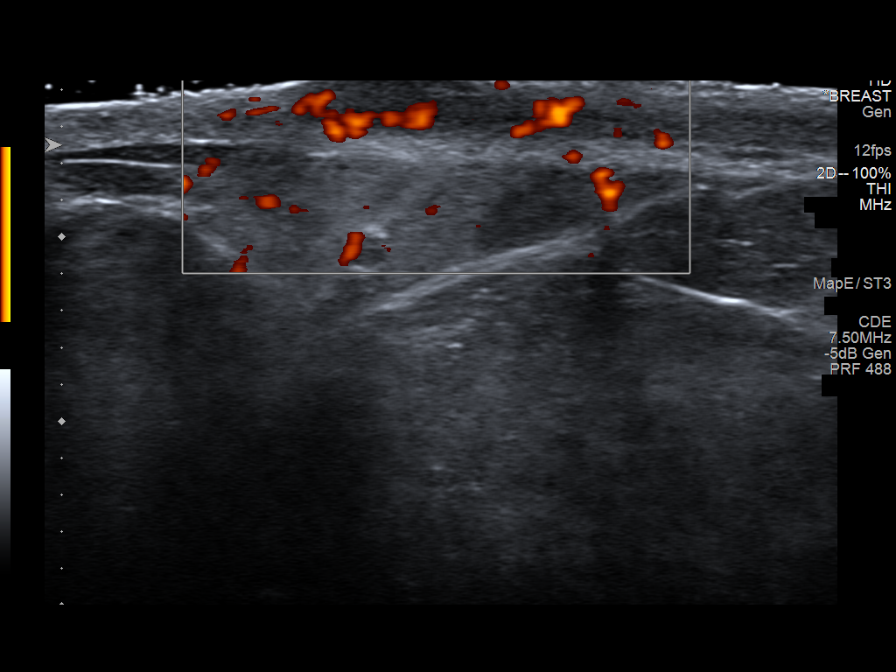
[im 5/7]
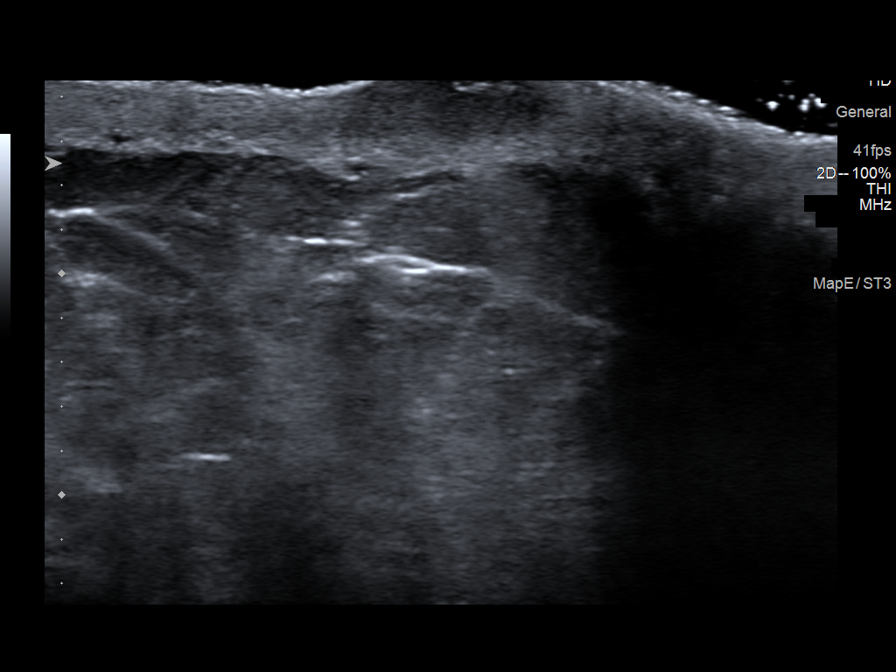
[im 6/7]
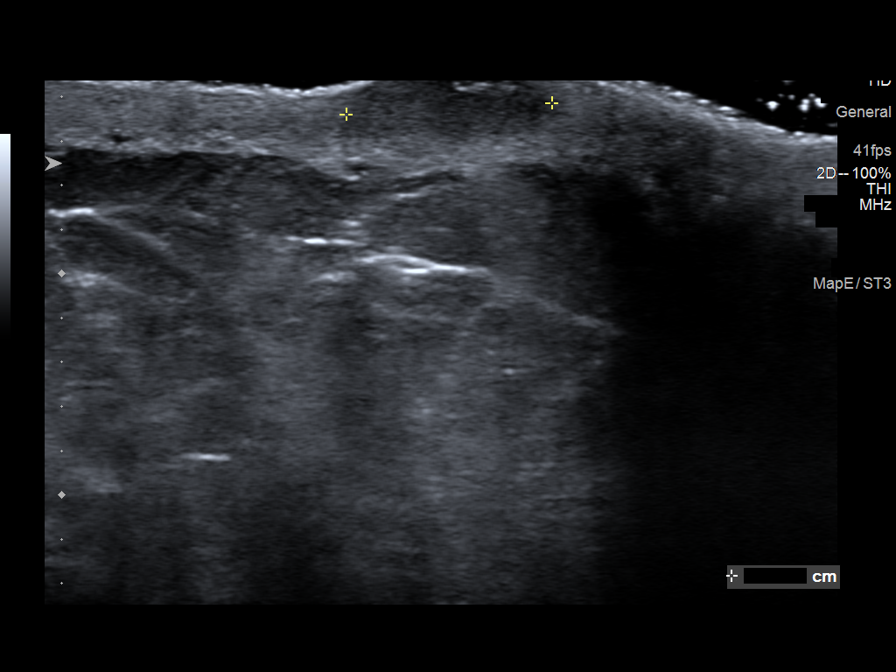
[im 7/7]
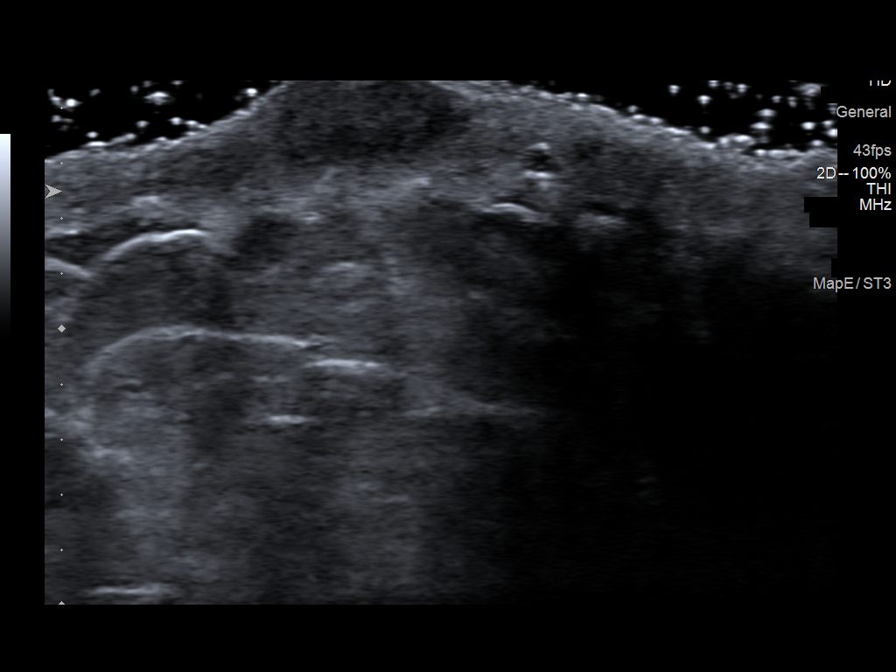

[7 of 7 positions shown; findings below may reference images not displayed]

FINDINGS: A BB indicating the palpable site of concern has been placed on the
inferior right breast. Skin thickening is noted at the palpable
site, however no masses are identified within the breast itself. No
suspicious calcifications, masses or areas of distortion are seen in
the bilateral breasts.

Mammographic images were processed with CAD.

On physical exam, there is a superficial palpable lump with
discoloration on the lower slightly outer right breast near the
inframammary fold.

Targeted ultrasound is performed, showing a mass within the skin in
the right breast at 7 o'clock, 7 cm from the nipple measuring 1.6 x
0.4 x 0.9 cm.
IMPRESSION: 1. The palpable mass in the inferior right breast corresponds with a
benign sebaceous cyst.

2.  No mammographic evidence of malignancy in the bilateral breasts.

RECOMMENDATION:
1. Screening mammogram at age 41 unless there are persistent or
intervening clinical concerns. (Code:Q0-V-A85)

2. Continued clinical follow-up for the infected sebaceous cyst. The
patient is currently on antibiotics.

I have discussed the findings and recommendations with the patient.
Results were also provided in writing at the conclusion of the
visit. If applicable, a reminder letter will be sent to the patient
regarding the next appointment.

BI-RADS CATEGORY  2: Benign.

## 2020-02-10 IMAGING — MG DIGITAL DIAGNOSTIC BILATERAL MAMMOGRAM WITH TOMO AND CAD
8 of 15 series · 8 of 40 positions shown · non-contrast
Comparison: Previous exam(s).

ACR Breast Density Category a: The breast tissue is almost entirely
fatty.

CLINICAL DATA: 32-year-old female presenting for evaluation of a
palpable lump in the inferior right breast. The patient states that
previously blood and pus was coming from this site, but she has been
on antibiotics since last [REDACTED] with improvement.

EXAM:
DIGITAL DIAGNOSTIC BILATERAL MAMMOGRAM WITH CAD AND TOMO
ULTRASOUND RIGHT BREAST

[L MLO synth-2D]
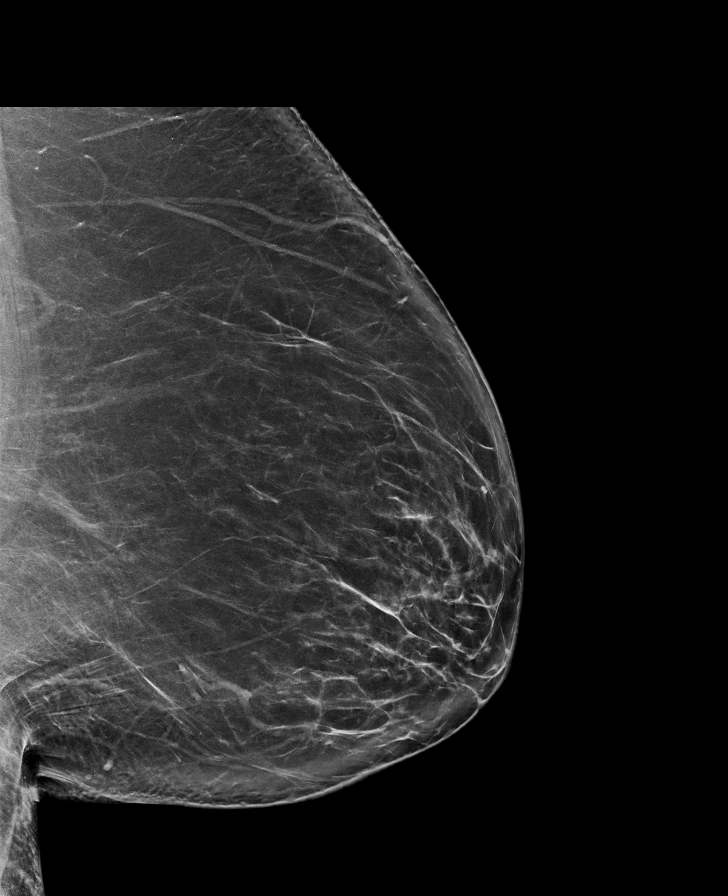

[R TAN synth-2D]
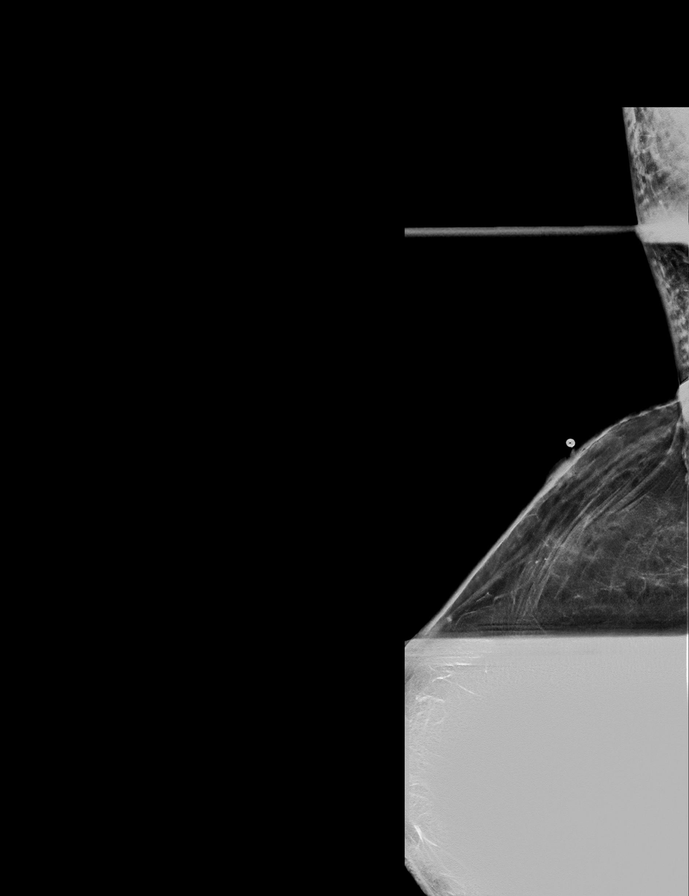

[R MLO synth-2D (1 of 2)]
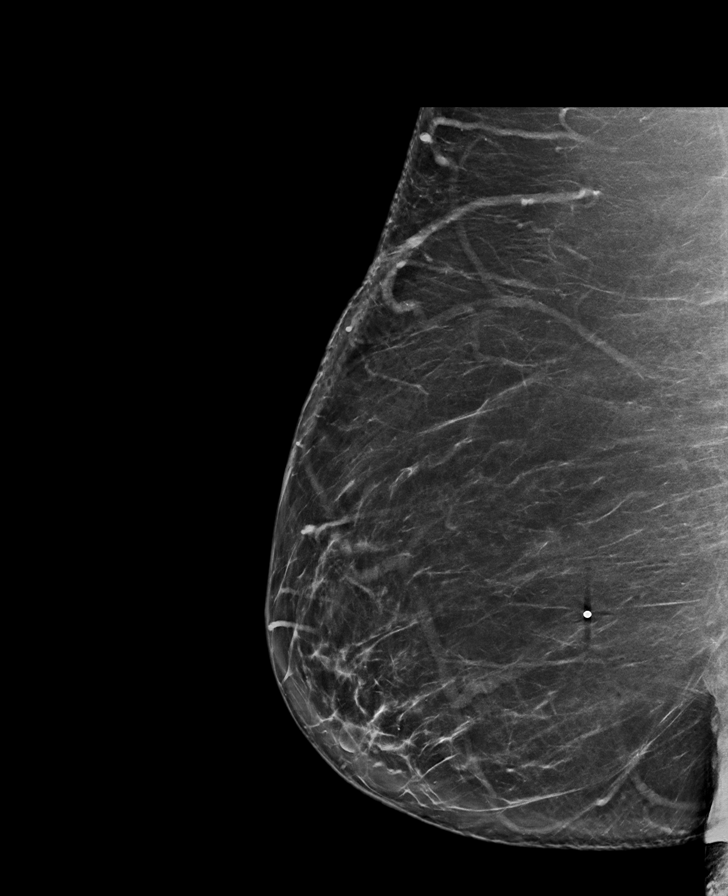

[R CV synth-2D]
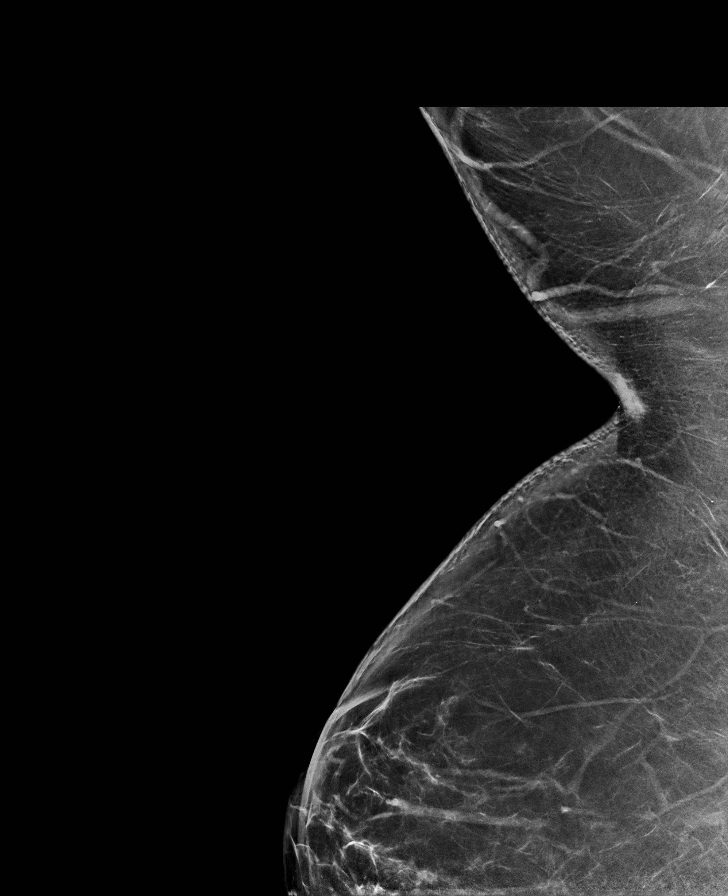

[R MLO synth-2D (2 of 2)]
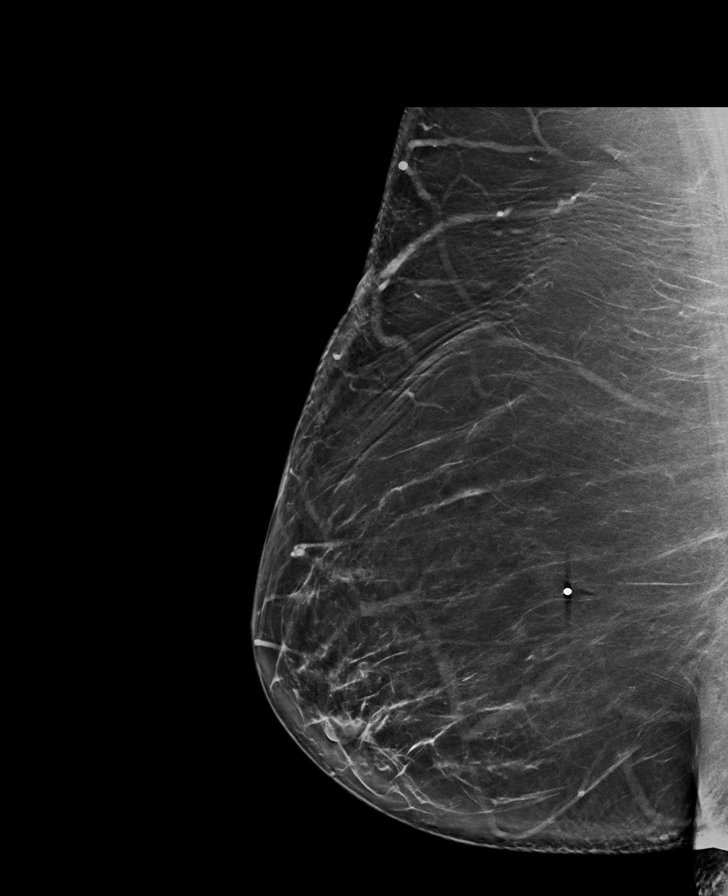

[L CC synth-2D]
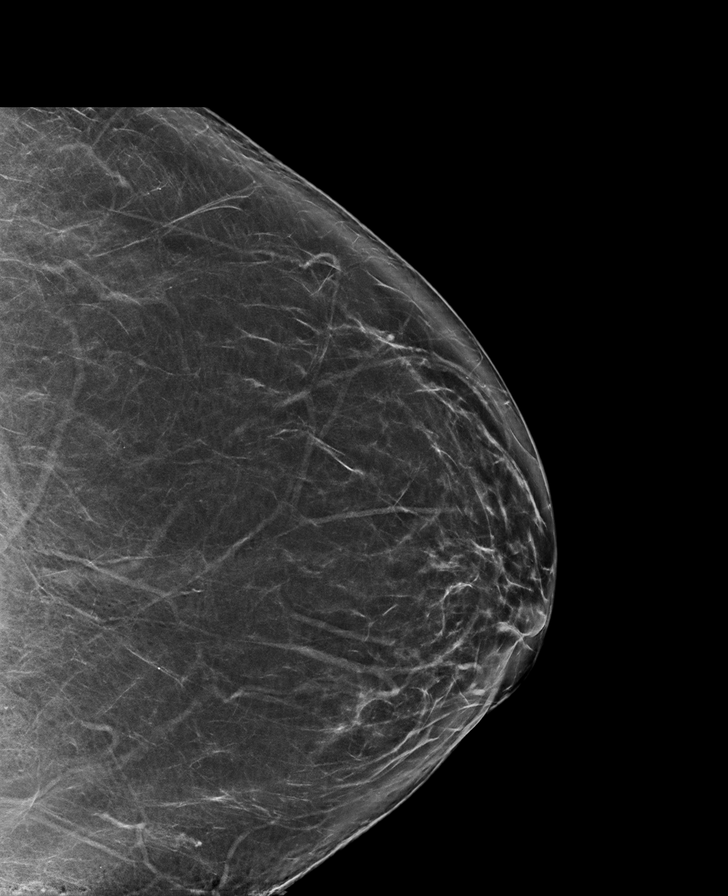

[R CC synth-2D]
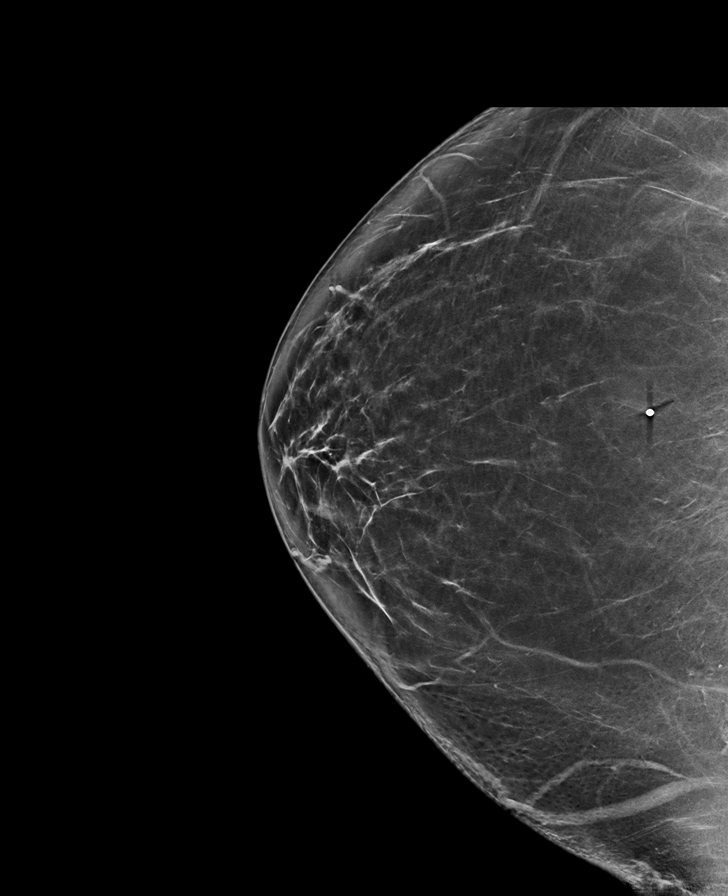

[L CC tomo · tomo slice 61/89.0]
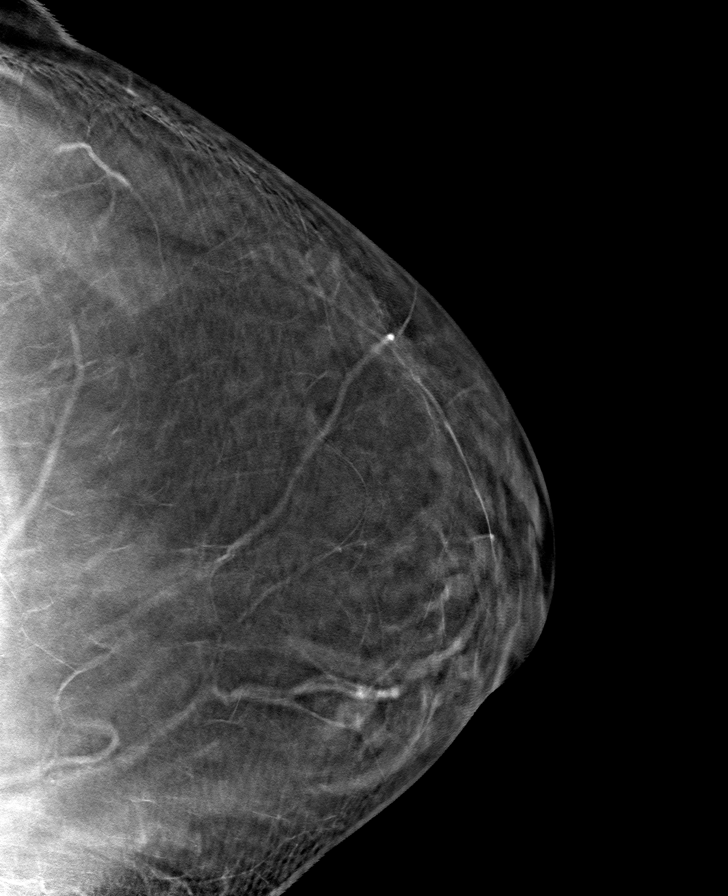

[8 of 40 positions shown; findings below may reference images not displayed]

FINDINGS: A BB indicating the palpable site of concern has been placed on the
inferior right breast. Skin thickening is noted at the palpable
site, however no masses are identified within the breast itself. No
suspicious calcifications, masses or areas of distortion are seen in
the bilateral breasts.

Mammographic images were processed with CAD.

On physical exam, there is a superficial palpable lump with
discoloration on the lower slightly outer right breast near the
inframammary fold.

Targeted ultrasound is performed, showing a mass within the skin in
the right breast at 7 o'clock, 7 cm from the nipple measuring 1.6 x
0.4 x 0.9 cm.
IMPRESSION: 1. The palpable mass in the inferior right breast corresponds with a
benign sebaceous cyst.

2.  No mammographic evidence of malignancy in the bilateral breasts.

RECOMMENDATION:
1. Screening mammogram at age 41 unless there are persistent or
intervening clinical concerns. (Code:Q0-V-A85)

2. Continued clinical follow-up for the infected sebaceous cyst. The
patient is currently on antibiotics.

I have discussed the findings and recommendations with the patient.
Results were also provided in writing at the conclusion of the
visit. If applicable, a reminder letter will be sent to the patient
regarding the next appointment.

BI-RADS CATEGORY  2: Benign.

## 2020-04-26 ENCOUNTER — Emergency Department (HOSPITAL_COMMUNITY)
Admission: EM | Admit: 2020-04-26 | Discharge: 2020-04-26 | Disposition: A | Discharge status: Home / Self Care | Payer: Self-pay | Attending: Emergency Medicine | Admitting: Emergency Medicine

## 2020-04-26 ENCOUNTER — Other Ambulatory Visit: Payer: Self-pay

## 2020-04-26 ENCOUNTER — Encounter (HOSPITAL_COMMUNITY): Payer: Self-pay | Admitting: Emergency Medicine

## 2020-04-26 DIAGNOSIS — Z87891 Personal history of nicotine dependence: Secondary | ICD-10-CM | POA: Insufficient documentation

## 2020-04-26 DIAGNOSIS — L0231 Cutaneous abscess of buttock: Secondary | ICD-10-CM | POA: Insufficient documentation

## 2020-04-26 DIAGNOSIS — L0291 Cutaneous abscess, unspecified: Secondary | ICD-10-CM

## 2020-04-26 MED ORDER — LIDOCAINE-EPINEPHRINE (PF) 2 %-1:200000 IJ SOLN
20.0000 mL | Freq: Once | INTRAMUSCULAR | Status: AC
  Administered 2020-04-26: 20 mL
  Filled 2020-04-26: qty 20

## 2020-04-26 NOTE — ED Triage Notes (Addendum)
Patient reports abscess to left buttock for years flare since yesterday. Denies drainage.
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
## 2020-04-26 NOTE — Discharge Instructions (Signed)
You can take Tylenol or ibuprofen for pain Keep wound clean with warm soap and water and keep bandage dry, do not submerge in water for 24 hours. Change bandage sooner if it gets dirty Return for fever, increased redness, swelling, pain, or worsening drainage. Return in two days for wound recheck and packing removal

## 2020-04-28 ENCOUNTER — Emergency Department (HOSPITAL_COMMUNITY)
Admission: EM | Admit: 2020-04-28 | Discharge: 2020-04-28 | Disposition: A | Discharge status: Home / Self Care | Payer: Self-pay | Attending: Emergency Medicine | Admitting: Emergency Medicine

## 2020-04-28 ENCOUNTER — Other Ambulatory Visit: Payer: Self-pay

## 2020-04-28 ENCOUNTER — Encounter (HOSPITAL_COMMUNITY): Payer: Self-pay

## 2020-04-28 DIAGNOSIS — Z87891 Personal history of nicotine dependence: Secondary | ICD-10-CM | POA: Insufficient documentation

## 2020-04-28 DIAGNOSIS — Z5189 Encounter for other specified aftercare: Secondary | ICD-10-CM

## 2020-04-28 DIAGNOSIS — Z4801 Encounter for change or removal of surgical wound dressing: Secondary | ICD-10-CM | POA: Insufficient documentation

## 2020-04-28 NOTE — ED Notes (Signed)
An After Visit Summary was printed and given to the patient. Discharge instructions given and no further questions at this time.  

## 2020-04-28 NOTE — Discharge Instructions (Addendum)
Call your primary care doctor or specialist as discussed in the next 4-5  days.   Return immediately back to the ER if:  Your symptoms worsen within the next 12-24 hours. You develop new symptoms such as new fevers, persistent vomiting, new pain, shortness of breath, or new weakness or numbness, or if you have any other concerns.

## 2020-04-28 NOTE — ED Triage Notes (Signed)
Pt arrived via walk in for wound check. States abscess was drained 2/19, told to return for wound check.

## 2020-04-28 NOTE — ED Notes (Addendum)
Dry dressing applied to left buttock wound site, small amount of bleeding noted at site.
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
## 2022-01-10 ENCOUNTER — Other Ambulatory Visit: Payer: Self-pay

## 2022-01-10 ENCOUNTER — Ambulatory Visit (HOSPITAL_COMMUNITY)
Admission: EM | Admit: 2022-01-10 | Discharge: 2022-01-10 | Disposition: A | Discharge status: Home / Self Care | Payer: Commercial Managed Care - HMO | Attending: Emergency Medicine | Admitting: Emergency Medicine

## 2022-01-10 ENCOUNTER — Encounter (HOSPITAL_COMMUNITY): Payer: Self-pay | Admitting: *Deleted

## 2022-01-10 DIAGNOSIS — L0231 Cutaneous abscess of buttock: Secondary | ICD-10-CM

## 2022-01-10 MED ORDER — SULFAMETHOXAZOLE-TRIMETHOPRIM 800-160 MG PO TABS: 1.0000 | ORAL_TABLET | Freq: Two times a day (BID) | ORAL | 0 refills | Status: AC

## 2022-01-10 MED ORDER — DICLOFENAC SODIUM 75 MG PO TBEC: 75.0000 mg | DELAYED_RELEASE_TABLET | Freq: Two times a day (BID) | ORAL | 0 refills | Status: DC

## 2022-01-10 MED ORDER — KETOROLAC TROMETHAMINE 30 MG/ML IJ SOLN
INTRAMUSCULAR | Status: AC
  Filled 2022-01-10: qty 1

## 2022-01-10 MED ORDER — KETOROLAC TROMETHAMINE 30 MG/ML IJ SOLN
30.0000 mg | Freq: Once | INTRAMUSCULAR | Status: AC
  Administered 2022-01-10: 30 mg via INTRAMUSCULAR

## 2022-01-10 MED ORDER — LIDOCAINE-EPINEPHRINE 1 %-1:100000 IJ SOLN
INTRAMUSCULAR | Status: AC
  Filled 2022-01-10: qty 1
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
## 2022-01-10 NOTE — ED Triage Notes (Signed)
Pt reports an abscess on Lt buttocks. Pain was worse last night. Pt can not sleep due to pain.

## 2022-01-10 NOTE — Discharge Instructions (Addendum)
Bactrim has been sent to the pharmacy, he will take this medication 2 times daily for the next 7 days.  Diclofenac is an anti-inflammatory medication, its been sent to the pharmacy, you can start taking this tomorrow, you may use this 2 times daily with 12 hours between each dose.  This medication can help with inflammation and pain.   Please continue to keep the area clean and may apply gauze to the site to catch the drainage.   If you begin having any increasing pain, increasing redness, or symptoms are not improving please follow-up with this office or your PCP.   I have provided a dermatologist to your paperwork if you would like to schedule an appointment with them, as discussed.

## 2022-07-23 ENCOUNTER — Ambulatory Visit (HOSPITAL_COMMUNITY)
Admission: EM | Admit: 2022-07-23 | Discharge: 2022-07-23 | Disposition: A | Discharge status: Home / Self Care | Payer: Medicaid Other | Attending: Family Medicine | Admitting: Family Medicine

## 2022-07-23 ENCOUNTER — Encounter (HOSPITAL_COMMUNITY): Payer: Self-pay

## 2022-07-23 DIAGNOSIS — L0231 Cutaneous abscess of buttock: Secondary | ICD-10-CM | POA: Diagnosis not present

## 2022-07-23 MED ORDER — LIDOCAINE HCL (PF) 1 % IJ SOLN
INTRAMUSCULAR | Status: AC
  Filled 2022-07-23: qty 30

## 2022-07-23 MED ORDER — SULFAMETHOXAZOLE-TRIMETHOPRIM 800-160 MG PO TABS: 1.0000 | ORAL_TABLET | Freq: Two times a day (BID) | ORAL | 0 refills | Status: AC

## 2022-07-23 NOTE — ED Triage Notes (Signed)
Pt c/o abscess to lt buttocks x5 days. States using heat/ice, and OTC with no relief.
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
## 2022-07-23 NOTE — Discharge Instructions (Signed)
You were seen today for an abscess, which was opened and drained today.  I have sent out bactrim twice/day x 7 days.  Please keep covered while draining.  Follow up if needed.

## 2022-09-25 ENCOUNTER — Ambulatory Visit
Admission: EM | Admit: 2022-09-25 | Discharge: 2022-09-25 | Disposition: A | Discharge status: Home / Self Care | Payer: Medicaid Other | Attending: Internal Medicine | Admitting: Internal Medicine

## 2022-09-25 DIAGNOSIS — Z113 Encounter for screening for infections with a predominantly sexual mode of transmission: Secondary | ICD-10-CM | POA: Insufficient documentation

## 2022-09-25 DIAGNOSIS — E669 Obesity, unspecified: Secondary | ICD-10-CM | POA: Insufficient documentation

## 2022-09-25 DIAGNOSIS — N6099 Unspecified benign mammary dysplasia of unspecified breast: Secondary | ICD-10-CM | POA: Insufficient documentation

## 2022-09-25 DIAGNOSIS — L03115 Cellulitis of right lower limb: Secondary | ICD-10-CM | POA: Diagnosis not present

## 2022-09-25 MED ORDER — DOXYCYCLINE HYCLATE 100 MG PO CAPS: 100.0000 mg | ORAL_CAPSULE | Freq: Two times a day (BID) | ORAL | 0 refills | Status: DC
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
## 2022-09-25 NOTE — ED Triage Notes (Signed)
"  I have a Knot on inside of my right upper leg". Painful to touch and red "no warmth". No fever. No injury.

## 2022-09-25 NOTE — Discharge Instructions (Signed)
You were seen today for redness of your right thigh.  Your exam is consistent with a bacterial infection of the skin.  You may apply hot compresses 3 times daily.  I am putting you on antibiotics twice daily for the next 10 days.  You also wanted STD screening.  You will be called with any positive result with the appropriate treatment.  Please follow-up if your symptoms persist or worsen.

## 2022-09-27 LAB — CERVICOVAGINAL ANCILLARY ONLY
Bacterial Vaginitis (gardnerella): NEGATIVE
Candida Glabrata: NEGATIVE
Candida Vaginitis: NEGATIVE
Chlamydia: NEGATIVE
Comment: NEGATIVE
Comment: NEGATIVE
Comment: NEGATIVE
Comment: NEGATIVE
Comment: NEGATIVE
Comment: NORMAL
Neisseria Gonorrhea: NEGATIVE
Trichomonas: NEGATIVE

## 2022-09-28 LAB — HEPATITIS C ANTIBODY: Hep C Virus Ab: NONREACTIVE

## 2022-09-28 LAB — HIV ANTIBODY (ROUTINE TESTING W REFLEX): HIV Screen 4th Generation wRfx: NONREACTIVE

## 2022-09-28 LAB — RPR: RPR Ser Ql: NONREACTIVE

## 2022-11-28 ENCOUNTER — Encounter (HOSPITAL_COMMUNITY): Payer: Self-pay

## 2022-11-28 ENCOUNTER — Ambulatory Visit (HOSPITAL_COMMUNITY)
Admission: EM | Admit: 2022-11-28 | Discharge: 2022-11-28 | Disposition: A | Discharge status: Home / Self Care | Payer: Medicaid Other | Attending: Family Medicine | Admitting: Family Medicine

## 2022-11-28 DIAGNOSIS — L0231 Cutaneous abscess of buttock: Secondary | ICD-10-CM

## 2022-11-28 MED ORDER — SULFAMETHOXAZOLE-TRIMETHOPRIM 800-160 MG PO TABS: 1.0000 | ORAL_TABLET | Freq: Two times a day (BID) | ORAL | 0 refills | Status: AC

## 2022-11-28 MED ORDER — LIDOCAINE-EPINEPHRINE 1 %-1:100000 IJ SOLN
INTRAMUSCULAR | Status: AC
  Filled 2022-11-28: qty 1

## 2022-11-28 MED ORDER — KETOROLAC TROMETHAMINE 30 MG/ML IJ SOLN
INTRAMUSCULAR | Status: AC
  Filled 2022-11-28: qty 1

## 2022-11-28 MED ORDER — KETOROLAC TROMETHAMINE 30 MG/ML IJ SOLN
30.0000 mg | Freq: Once | INTRAMUSCULAR | Status: AC
  Administered 2022-11-28: 30 mg via INTRAMUSCULAR
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
## 2022-11-28 NOTE — Discharge Instructions (Signed)
It was nice seeing you. We were able to drain your left gluteal abscess. I have place you on antibiotics for this as well. Please see your PCP soon for follow up.

## 2022-11-28 NOTE — ED Triage Notes (Signed)
Pt presents to office for abscess on her buttocks. Pt states the abscess has been there for over 2 years.
